# Patient Record
Sex: Male | Born: 1965
Health system: Southern US, Community
[De-identification: ages and names within clinical notes are randomized; demographics above are authoritative.]

## PROBLEM LIST (undated history)

## (undated) HISTORY — PX: ORIF DISTAL FEMUR FRACTURE: SUR926

## (undated) HISTORY — PX: OTHER SURGICAL HISTORY: SHX169

---

## 2016-03-23 ENCOUNTER — Inpatient Hospital Stay (HOSPITAL_COMMUNITY)
Admission: EM | Admit: 2016-03-23 | Discharge: 2016-03-27 | DRG: 607 | Disposition: A | Discharge status: Home / Self Care | Payer: Medicaid Other | Attending: Internal Medicine | Admitting: Internal Medicine

## 2016-03-23 ENCOUNTER — Encounter (HOSPITAL_COMMUNITY): Payer: Self-pay | Admitting: Emergency Medicine

## 2016-03-23 ENCOUNTER — Observation Stay (HOSPITAL_COMMUNITY): Payer: Medicaid Other

## 2016-03-23 ENCOUNTER — Emergency Department (HOSPITAL_COMMUNITY): Payer: Medicaid Other

## 2016-03-23 DIAGNOSIS — T50905A Adverse effect of unspecified drugs, medicaments and biological substances, initial encounter: Secondary | ICD-10-CM

## 2016-03-23 DIAGNOSIS — L0231 Cutaneous abscess of buttock: Secondary | ICD-10-CM | POA: Diagnosis present

## 2016-03-23 DIAGNOSIS — L270 Generalized skin eruption due to drugs and medicaments taken internally: Principal | ICD-10-CM | POA: Diagnosis present

## 2016-03-23 DIAGNOSIS — L02419 Cutaneous abscess of limb, unspecified: Secondary | ICD-10-CM

## 2016-03-23 DIAGNOSIS — R5381 Other malaise: Secondary | ICD-10-CM | POA: Diagnosis present

## 2016-03-23 DIAGNOSIS — M2550 Pain in unspecified joint: Secondary | ICD-10-CM

## 2016-03-23 DIAGNOSIS — Z720 Tobacco use: Secondary | ICD-10-CM

## 2016-03-23 DIAGNOSIS — Z882 Allergy status to sulfonamides status: Secondary | ICD-10-CM

## 2016-03-23 DIAGNOSIS — L02416 Cutaneous abscess of left lower limb: Secondary | ICD-10-CM | POA: Diagnosis present

## 2016-03-23 DIAGNOSIS — L0291 Cutaneous abscess, unspecified: Secondary | ICD-10-CM

## 2016-03-23 DIAGNOSIS — T7840XA Allergy, unspecified, initial encounter: Secondary | ICD-10-CM

## 2016-03-23 DIAGNOSIS — F17210 Nicotine dependence, cigarettes, uncomplicated: Secondary | ICD-10-CM | POA: Diagnosis present

## 2016-03-23 DIAGNOSIS — D72829 Elevated white blood cell count, unspecified: Secondary | ICD-10-CM

## 2016-03-23 DIAGNOSIS — K529 Noninfective gastroenteritis and colitis, unspecified: Secondary | ICD-10-CM | POA: Diagnosis present

## 2016-03-23 DIAGNOSIS — T370X5A Adverse effect of sulfonamides, initial encounter: Secondary | ICD-10-CM | POA: Diagnosis present

## 2016-03-23 DIAGNOSIS — E860 Dehydration: Secondary | ICD-10-CM | POA: Diagnosis present

## 2016-03-23 LAB — URINALYSIS, ROUTINE W REFLEX MICROSCOPIC
Glucose, UA: NEGATIVE mg/dL
KETONES UR: NEGATIVE mg/dL
Leukocytes, UA: NEGATIVE
Nitrite: NEGATIVE
PROTEIN: NEGATIVE mg/dL
Specific Gravity, Urine: 1.036 — ABNORMAL HIGH (ref 1.005–1.030)
pH: 5.5 (ref 5.0–8.0)

## 2016-03-23 LAB — CBC WITH DIFFERENTIAL/PLATELET
Basophils Absolute: 0 10*3/uL (ref 0.0–0.1)
Basophils Relative: 0 %
EOS PCT: 3 %
Eosinophils Absolute: 0.5 10*3/uL (ref 0.0–0.7)
HEMATOCRIT: 47.7 % (ref 39.0–52.0)
Hemoglobin: 17 g/dL (ref 13.0–17.0)
LYMPHS ABS: 4.8 10*3/uL — AB (ref 0.7–4.0)
Lymphocytes Relative: 31 %
MCH: 35.7 pg — ABNORMAL HIGH (ref 26.0–34.0)
MCHC: 35.6 g/dL (ref 30.0–36.0)
MCV: 100.2 fL — AB (ref 78.0–100.0)
MONOS PCT: 9 %
Monocytes Absolute: 1.4 10*3/uL — ABNORMAL HIGH (ref 0.1–1.0)
NEUTROS ABS: 8.7 10*3/uL — AB (ref 1.7–7.7)
NEUTROS PCT: 57 %
Platelets: 241 10*3/uL (ref 150–400)
RBC: 4.76 MIL/uL (ref 4.22–5.81)
RDW: 12.8 % (ref 11.5–15.5)
WBC: 15.4 10*3/uL — ABNORMAL HIGH (ref 4.0–10.5)

## 2016-03-23 LAB — URINE MICROSCOPIC-ADD ON: WBC, UA: NONE SEEN WBC/hpf (ref 0–5)

## 2016-03-23 LAB — HIV ANTIBODY (ROUTINE TESTING W REFLEX): HIV Screen 4th Generation wRfx: NONREACTIVE

## 2016-03-23 LAB — COMPREHENSIVE METABOLIC PANEL
ALK PHOS: 84 U/L (ref 38–126)
ALT: 30 U/L (ref 17–63)
AST: 32 U/L (ref 15–41)
Albumin: 4 g/dL (ref 3.5–5.0)
Anion gap: 8 (ref 5–15)
BILIRUBIN TOTAL: 0.9 mg/dL (ref 0.3–1.2)
BUN: 21 mg/dL — AB (ref 6–20)
CALCIUM: 9.6 mg/dL (ref 8.9–10.3)
CO2: 25 mmol/L (ref 22–32)
Chloride: 101 mmol/L (ref 101–111)
Creatinine, Ser: 0.94 mg/dL (ref 0.61–1.24)
GFR calc Af Amer: >60 mL/min (ref >60)
Glucose, Bld: 108 mg/dL — ABNORMAL HIGH (ref 65–99)
POTASSIUM: 4.1 mmol/L (ref 3.5–5.1)
Sodium: 134 mmol/L — ABNORMAL LOW (ref 135–145)
TOTAL PROTEIN: 8.8 g/dL — AB (ref 6.5–8.1)

## 2016-03-23 LAB — CK TOTAL AND CKMB (NOT AT ARMC)
CK TOTAL: 149 U/L (ref 49–397)
CK, MB: 3.4 ng/mL (ref 0.5–5.0)
RELATIVE INDEX: 2.3 (ref 0.0–2.5)

## 2016-03-23 LAB — SEDIMENTATION RATE: SED RATE: 14 mm/h (ref 0–16)

## 2016-03-23 LAB — LACTATE DEHYDROGENASE: LDH: 140 U/L (ref 98–192)

## 2016-03-23 LAB — C-REACTIVE PROTEIN: CRP: 3.3 mg/dL — AB (ref <1.0)

## 2016-03-23 MED ORDER — IOPAMIDOL (ISOVUE-300) INJECTION 61%
INTRAVENOUS | Status: AC
  Administered 2016-03-23: 100 mL
  Filled 2016-03-23: qty 100

## 2016-03-23 MED ORDER — SODIUM CHLORIDE 0.9 % IV BOLUS (SEPSIS)
1000.0000 mL | Freq: Once | INTRAVENOUS | Status: AC
  Administered 2016-03-23: 1000 mL via INTRAVENOUS

## 2016-03-23 MED ORDER — ONDANSETRON HCL 4 MG PO TABS: 4.0000 mg | ORAL_TABLET | Freq: Four times a day (QID) | ORAL | Status: DC | PRN

## 2016-03-23 MED ORDER — ACETAMINOPHEN 325 MG PO TABS: 650.0000 mg | ORAL_TABLET | Freq: Four times a day (QID) | ORAL | Status: DC | PRN

## 2016-03-23 MED ORDER — VANCOMYCIN HCL IN DEXTROSE 1-5 GM/200ML-% IV SOLN
1000.0000 mg | Freq: Two times a day (BID) | INTRAVENOUS | Status: DC
  Administered 2016-03-23 – 2016-03-26 (×6): 1000 mg via INTRAVENOUS
  Filled 2016-03-23 (×7): qty 200

## 2016-03-23 MED ORDER — KETOROLAC TROMETHAMINE 15 MG/ML IJ SOLN
15.0000 mg | Freq: Four times a day (QID) | INTRAMUSCULAR | Status: DC
  Administered 2016-03-23 – 2016-03-24 (×4): 15 mg via INTRAVENOUS
  Filled 2016-03-23 (×5): qty 1

## 2016-03-23 MED ORDER — PIPERACILLIN-TAZOBACTAM 3.375 G IVPB
3.3750 g | Freq: Three times a day (TID) | INTRAVENOUS | Status: DC
  Administered 2016-03-23 – 2016-03-24 (×4): 3.375 g via INTRAVENOUS
  Filled 2016-03-23 (×6): qty 50

## 2016-03-23 MED ORDER — ENOXAPARIN SODIUM 40 MG/0.4ML ~~LOC~~ SOLN
40.0000 mg | SUBCUTANEOUS | Status: DC
  Administered 2016-03-23: 40 mg via SUBCUTANEOUS

## 2016-03-23 MED ORDER — ACETAMINOPHEN 650 MG RE SUPP: 650.0000 mg | Freq: Four times a day (QID) | RECTAL | Status: DC | PRN

## 2016-03-23 MED ORDER — SODIUM CHLORIDE 0.9 % IV SOLN
INTRAVENOUS | Status: DC
  Administered 2016-03-23 – 2016-03-25 (×4): via INTRAVENOUS

## 2016-03-23 MED ORDER — ONDANSETRON HCL 4 MG/2ML IJ SOLN: 4.0000 mg | Freq: Four times a day (QID) | INTRAMUSCULAR | Status: DC | PRN

## 2016-03-23 MED ORDER — KETOROLAC TROMETHAMINE 30 MG/ML IJ SOLN
30.0000 mg | Freq: Once | INTRAMUSCULAR | Status: AC
  Administered 2016-03-23: 30 mg via INTRAVENOUS
  Filled 2016-03-23: qty 1

## 2016-03-23 MED ORDER — VANCOMYCIN HCL 10 G IV SOLR
1500.0000 mg | Freq: Once | INTRAVENOUS | Status: AC
  Administered 2016-03-23: 1500 mg via INTRAVENOUS
  Filled 2016-03-23: qty 1500

## 2016-03-23 MED ORDER — NICOTINE 21 MG/24HR TD PT24
21.0000 mg | MEDICATED_PATCH | Freq: Every day | TRANSDERMAL | Status: DC
  Filled 2016-03-23 (×4): qty 1

## 2016-03-23 MED ORDER — DIAZEPAM 5 MG/ML IJ SOLN
1.0000 mg | Freq: Once | INTRAMUSCULAR | Status: AC
  Administered 2016-03-23: 1 mg via INTRAVENOUS
  Filled 2016-03-23: qty 2

## 2016-03-23 NOTE — H&P (Signed)
History and Physical    Richard Krueger MOQ:[id number] DOB: 26-May-1966 DOA: 03/23/2016   PCP: No PCP Per Patient   Patient coming from/Resides with: Private residence/lives with juvenile son  Chief Complaint: Diffuse myalgias and rash  HPI: Richard Krueger is a 50 y.o. male with medical history significant for ongoing tobacco abuse, remote orthopedic surgery with postoperative staph infection (patient thinks was MRSA) who was recently treated for an uncomplicated superficial abscess on the right medial thigh while working in [place] this past week. Patient noted that the abscess initially began as a tiny maculopapular area and increased in size this was associated with low-grade fevers. He went to the ER over the weekend where an local I&D was performed and cultures were obtained. Patient was empirically started on Bactrim. By Monday evening into Tuesday morning he had noticed a diffuse maculopapular rash without blisters primarily in the legs but someone in the upper extremities. There was no pruritis or urticaria/"hives". Patient stopped the Bactrim since he presumed this was the cause. He took a total of 9 pills over 4 days as instructed. He is not taking any other medications. He has not started any new medications, skin lotions, no changes in soap or washing powder. He has persisted with a low-grade fever. Since that time he has also had some generalized malaise with upset stomach without nausea vomiting or diarrhea, anorexia and poor oral intake. Has 24 hours of malaise has progressed to significant fatigue and reports of muscle tightness. No involvement of the jaw. Patient denies knowledge of tick bite.  ED Course:  Vital signs: 97.9-141/99-92-20 AAS: Chest x-ray unremarkable, nondilated gas-filled small bowel with focal thickening that may indicate enteritis or ileus no findings to suggest obstruction Lab data: Sodium 134, potassium 4.1, CO2 25, BUN 21, creatinine 0.94, glucose  108, LFTs are normal, total protein slightly elevated at 8.8, calcium 9.6, CK 149, WBC 15,400 with normal differential except for mildly elevated neutrophils 8.7% and mildly elevated lymphocytes of 4.8% and mildly elevated monocytes of 1.4%, eosinophils are normal in pathologist describes toxic granulation of WBCs; urinalysis abnormal with cloudy appearance, rare bacteria, small bilirubin, hyaline casts, amber color, small hemoglobin, no WBCs, no leukocytes, no nitrite, specific gravity elevated at 1.036 Medications and treatments: Normal saline bolus 1 L 2, Toradol 30 mg IV 1, Valium 1 mg IV 1  Review of Systems:  In addition to the HPI above,  No Headache, changes with Vision or hearing, new weakness, tingling, numbness in any extremity, No problems swallowing food or Liquids No Chest pain, Cough or Shortness of Breath, palpitations, orthopnea or DOE No Abdominal pain, emesis; no melena or hematochezia, no dark tarry stools No dysuria, hematuria or flank pain No new skin rashes, lesions, masses or bruises,s No recent weight gain or loss No polyuria, polydypsia or polyphagia,   History reviewed. No pertinent past medical history.  Past Surgical History:  Procedure Laterality Date  . ankle repair    . ORIF DISTAL FEMUR FRACTURE      Social History   Social History  . Marital status: Divorced    Spouse name: N/A  . Number of children: N/A  . Years of education: N/A   Occupational History  . Not on file.   Social History Main Topics  . Smoking status: Current Every Day Smoker  . Smokeless tobacco: Never Used  . Alcohol use Yes  . Drug use: No  . Sexual activity: Not on file   Other Topics Concern  .  Not on file   Social History Narrative  . No narrative on file    Mobility: Ambulates without assistive devices Work history: Currently employed   Allergies  Allergen Reactions  . Sulfa Antibiotics    Family history reviewed and no known history of inflammatory  bowel disease or rheumatological disorders  Prior to Admission medications   Not on File    Physical Exam: Vitals:   03/23/16 0700 03/23/16 0715 03/23/16 0745 03/23/16 0815  BP: 128/71 121/72 121/83 127/75  Pulse: 73 67 80 70  Resp:  16 16 16   Temp:      TempSrc:      SpO2: 93% 97% 99% 98%  Weight:      Height:          Constitutional: NAD, calm, comfortable Eyes: PERRL, lids and conjunctivae normal ENMT: Mucous membranes are moist. Posterior pharynx clear of any exudate or lesions.Normal dentition.  Neck: normal, supple, no masses, no thyromegaly Respiratory: clear to auscultation bilaterally, no wheezing, no crackles. Normal respiratory effort. No accessory muscle use.  Cardiovascular: Regular rate and rhythm, no murmurs / rubs / gallops. No extremity edema. 2+ pedal pulses. No carotid bruits.  Abdomen: no tenderness, no masses palpated. No hepatosplenomegaly. Bowel sounds positive.  Musculoskeletal: no clubbing / cyanosis. No joint deformity upper and lower extremities. Good ROM, no contractures. Normal muscle tone.  Skin: Patient has small quarter-sized lesion with appearance of abscess and right medial thigh-small ulcerated center with purulent drainage expressed and areas quite tender to touch; patient also has maculopapular rash diffusely over the lower extremities-these lesions are very tiny about 2-3 mm maximum size and are subtly raised and blanchable-there are areas on the upper extremities that appear to be similar but fading and the parents and likely resolving Neurologic: CN 2-12 grossly intact. Sensation intact, DTR normal. Strength 5/5 x all 4 extremities.  Psychiatric: Normal judgment and insight. Alert and oriented x 3. Normal mood.   Rash:   Right thigh abscess:   Labs on Admission: I have personally reviewed following labs and imaging studies  CBC:  Recent Labs Lab 03/23/16 0159  WBC 15.4*  NEUTROABS 8.7*  HGB 17.0  HCT 47.7  MCV 100.2*  PLT 241     Basic Metabolic Panel:  Recent Labs Lab 03/23/16 0159  NA 134*  K 4.1  CL 101  CO2 25  GLUCOSE 108*  BUN 21*  CREATININE 0.94  CALCIUM 9.6   GFR: Estimated Creatinine Clearance: 103.4 mL/min (by C-G formula based on SCr of 0.94 mg/dL). Liver Function Tests:  Recent Labs Lab 03/23/16 0159  AST 32  ALT 30  ALKPHOS 84  BILITOT 0.9  PROT 8.8*  ALBUMIN 4.0   No results for input(s): LIPASE, AMYLASE in the last 168 hours. No results for input(s): AMMONIA in the last 168 hours. Coagulation Profile: No results for input(s): INR, PROTIME in the last 168 hours. Cardiac Enzymes:  Recent Labs Lab 03/23/16 0159  CKTOTAL 149  CKMB 3.4   BNP (last 3 results) No results for input(s): PROBNP in the last 8760 hours. HbA1C: No results for input(s): HGBA1C in the last 72 hours. CBG: No results for input(s): GLUCAP in the last 168 hours. Lipid Profile: No results for input(s): CHOL, HDL, LDLCALC, TRIG, CHOLHDL, LDLDIRECT in the last 72 hours. Thyroid Function Tests: No results for input(s): TSH, T4TOTAL, FREET4, T3FREE, THYROIDAB in the last 72 hours. Anemia Panel: No results for input(s): VITAMINB12, FOLATE, FERRITIN, TIBC, IRON, RETICCTPCT in the last 72  hours. Urine analysis:    Component Value Date/Time   COLORURINE AMBER (A) 03/23/2016 0200   APPEARANCEUR CLOUDY (A) 03/23/2016 0200   LABSPEC 1.036 (H) 03/23/2016 0200   PHURINE 5.5 03/23/2016 0200   GLUCOSEU NEGATIVE 03/23/2016 0200   HGBUR SMALL (A) 03/23/2016 0200   BILIRUBINUR SMALL (A) 03/23/2016 0200   KETONESUR NEGATIVE 03/23/2016 0200   PROTEINUR NEGATIVE 03/23/2016 0200   NITRITE NEGATIVE 03/23/2016 0200   LEUKOCYTESUR NEGATIVE 03/23/2016 0200   Sepsis Labs: @LABRCNTIP (procalcitonin:4,lacticidven:4) )No results found for this or any previous visit (from the past 240 hour(s)).   Radiological Exams on Admission: Dg Abdomen Acute W/chest  Result Date: 03/23/2016 CLINICAL DATA:  Allergic reaction to  the antibiotic. Patient has lost muscle control of legs. Fever. EXAM: DG ABDOMEN ACUTE W/ 1V CHEST COMPARISON:  None. FINDINGS: Normal heart size and pulmonary vascularity. No focal airspace disease or consolidation in the lungs. No blunting of costophrenic angles. No pneumothorax. Mediastinal contours appear intact. Scattered gas and stool throughout the colon. Gas-filled nondilated left upper quadrant small bowel with apparent fold thickening. This may be due to ileus or enteritis. No free intra-abdominal air. No abnormal air-fluid levels. No radiopaque stones. Visualized bones appear intact. IMPRESSION: No evidence of active pulmonary disease. Nondilated gas-filled small bowel with fold thickening may indicate enteritis or ileus. No findings to suggest obstruction. Electronically Signed   By: Lucienne Capers M.D.   On: 03/23/2016 06:33     Assessment/Plan Principal Problem:   Allergic reaction caused by a drug -Patient presents with partially resolving rash (after cessation of medication) presumably related to Bactrim; initial and current rash not urticarial and appears similar to a petechial rash but platelets are normal -Patient reports related malaise, fatigue and myalgias with normal CK i.e. no evidence of acute myositis -Check CRP and ESR -Current eosinophils normal -Since not urticarial no indication for steroids and rash should be self-limiting -Scheduled IV Toradol for myalgias -LFTs are normal  Active Problems:   Leukocytosis/Thigh abscess -Patient presents with incompletely treated thigh abscess which is currently still expressing purulent drainage and is tender and somewhat erythematous -he also has persistent associated leukocytosis -Obtain blood cultures -I have requested records from the [facility] in [place] (primarily wound cultures obtained in the ER) -Patient reports history of prior significant staph infection that he was told was "a super staph" so  potentially possible history of MRSA so have asked pharmacy to dose vancomycin -ESR and CRP as above; also check HIV -WOC RN eval for recommendations but likely once to twice daily Neosporin dressings would be adequate    ?? Enteritis -Patient endorses vague GI symptoms without emesis or diarrhea but does describe nausea anorexia and poor oral intake -Plain x-ray suggestive of a possible enteritis versus ileus pattern that could either be related to acute allergic reaction as documented above were potentially related to GI effects from antibiotics -Monitor for onset of diarrhea -Allow regular diet for now -No known history of inflammatory bowel disease or celiac disease and has noted no prior recent history of diarrhea; abdominal exam benign    Dehydration, moderate -Patient endorsed poor oral intake for several days and he appears clinically dehydrated based on elevated urine specific gravity -Has received 2 L of fluid in the ER and will continue IV fluids at 75 an hour for now -Labs in a.m.    Tobacco abuse -Nicotine patch      DVT prophylaxis: Lovenox Code Status: Full Family Communication: The only family at the  patient's bedside was his juvenile son Disposition Plan: Anticipate discharge back to preadmission home environment once medically stable Consults called: None  Admission status: Observation/medical floor    Samie Barclift L. ANP-BC [facility] Pager [phone]   If 7PM-7AM, please contact night-coverage [email or web address] Password [facility]  03/23/2016, 8:17 AM

## 2016-03-23 NOTE — ED Triage Notes (Signed)
Patient states that he was seen on Saturday and had a boil lanced in [place] in [place].  Patient now having right arm swelling into hand, red spots coming up on inner thighs bilaterally.  Patient having muscle and joint pain and pain when walking.  Patient states that it is more on the left than right.

## 2016-03-23 NOTE — ED Notes (Signed)
Report attempted x 1

## 2016-03-23 NOTE — Progress Notes (Signed)
Pharmacy Antibiotic Note  Richard Krueger is a 50 y.o. male admitted on 03/23/2016 with cellulitis.  Pharmacy has been consulted for vancomycin dosing.  Pt had [facility] (left upper leg at the crease of buttock).  Now has rash on legs and arms and L leg swollen. He has been on bactrim since 8/12 ED visit.  Wt 86.2 kg, WBC 15.4, ANC 8.7, creat 0.94, AF.   Plan: vancomycin 1500 mg loading dose in the ED followed by Vancomycin 1000 IV every 12 hours.  Goal trough 10-15 mcg/mL.  F/u renal fxn, wbc, temp, cultures VT as needed  Height: 5\' 9"  (175.3 cm) Weight: 190 lb (86.2 kg) IBW/kg (Calculated) : 70.7  Temp (24hrs), Avg:97.9 F (36.6 C), Min:97.9 F (36.6 C), Max:97.9 F (36.6 C)   Recent Labs Lab 03/23/16 0159  WBC 15.4*  CREATININE 0.94    Estimated Creatinine Clearance: 103.4 mL/min (by C-G formula based on SCr of 0.94 mg/dL).    Allergies  Allergen Reactions  . Sulfa Antibiotics     Antimicrobials this admission: vanc 8/18>>  Dose adjustments this admission:  Microbiology results: 8/18 BCx2>> HIV>>  Thank you for allowing pharmacy to be a part of this patient's care.   Herby AbrahamMichelle T. Bertie Simien, Pharm.D. [phone] 03/23/2016 7:55 AM

## 2016-03-23 NOTE — ED Notes (Signed)
Admitting at bedside 

## 2016-03-23 NOTE — ED Notes (Signed)
Patient states on Aug 12 he was seen at a ED in [place] and had an abscess drained (left upper leg at the crease of the buttock)  Stated he started feeling better for a couple of days and then started with a rash (red areas on legs and arms) right hand swollen and left leg swollen.Patient ambulates but moves very slowly

## 2016-03-23 NOTE — ED Notes (Signed)
[facility] Lab tech at bedside drawing second set of blood cultures.

## 2016-03-23 NOTE — Progress Notes (Addendum)
ADDENDUM: Patient also has another nodular abscess on the left gluteal region-I neglected to add this to the physical exam or problem list at time of admission. Highly appreciate general surgery evaluation  1816:  I have received the wound culture report from the hospital in [place] (in hard copy chart at nurses station): MRSA sensitive to Tygacil, Cipro, gentamicin, levaquin, ofloxacin, rifampin, vancomycin. We'll continue IV vancomycin for now. Follow-up on blood cultures and if these are positive patient will need to undergo echocardiogram to ensure no endocarditis.  Junious SilkAllison Kerrington Greenhalgh, ANP

## 2016-03-23 NOTE — ED Provider Notes (Signed)
[facility]-EMERGENCY DEPT Provider Note   CSN: [id number] Arrival date & time: 03/23/16  0141     History   Chief Complaint Chief Complaint  Patient presents with  . Arm Swelling    HPI Richard Krueger is a 50 y.o. male.  The history is provided by the patient.  Rash   This is a new problem. The current episode started more than 2 days ago. The problem has been gradually improving. Associated with: lanced abscesses in [place] started on Bactrim DS. There has been no fever. The rash is present on the left upper leg, left lower leg, right arm, right lower leg, right upper leg and left arm. The pain is moderate. The pain has been constant since onset. Associated symptoms include pain. Pertinent negatives include no blisters. The treatment provided no relief. Risk factors include new medications.  Seen in [place] for 2 abscesses one where L posterior upper leg meets buttocks one on right hip area.  Started on bactrim and diffuse purple rashe developed.  No has forearm edema and joint pain and stiffness all over despite stopping meds on Tuesday  History reviewed. No pertinent past medical history.  There are no active problems to display for this patient.   Past Surgical History:  Procedure Laterality Date  . ankle repair    . ORIF DISTAL FEMUR FRACTURE         Home Medications    Prior to Admission medications   Not on File    Family History History reviewed. No pertinent family history.  Social History Social History  Substance Use Topics  . Smoking status: Current Every Day Smoker  . Smokeless tobacco: Never Used  . Alcohol use Yes     Allergies   Sulfa antibiotics   Review of Systems Review of Systems  Constitutional: Negative for fever.  HENT: Negative for congestion, drooling and trouble swallowing.   Respiratory: Negative for shortness of breath.   Cardiovascular: Negative for chest pain.  Gastrointestinal: Positive for abdominal distention. Negative for  constipation.  Genitourinary: Negative for dysuria.  Musculoskeletal: Positive for arthralgias and joint swelling. Negative for neck pain and neck stiffness.  Skin: Positive for rash.  Neurological: Negative for syncope.  Hematological: Negative for adenopathy.  All other systems reviewed and are negative.    Physical Exam Updated Vital Signs BP 141/99   Pulse 92   Temp 97.9 F (36.6 C) (Oral)   Resp 20   Ht 5\' 9"  (1.753 m)   Wt 190 lb (86.2 kg)   SpO2 95%   BMI 28.06 kg/m   Physical Exam  Constitutional: He is oriented to person, place, and time. He appears well-developed and well-nourished. No distress.  HENT:  Head: Normocephalic and atraumatic.  Nose: Nose normal.  Mouth/Throat: No oropharyngeal exudate.  Eyes: Conjunctivae and EOM are normal. Pupils are equal, round, and reactive to light.  Neck: Normal range of motion. Neck supple.  Cardiovascular: Normal rate and regular rhythm.   Pulmonary/Chest: Effort normal and breath sounds normal. No stridor. He has no wheezes. He has no rales.  Abdominal: Soft. Bowel sounds are normal. He exhibits no mass. There is no tenderness. There is no rebound and no guarding.  Musculoskeletal: He exhibits edema.  Edema of B dorsal forearmd  Neurological: He is alert and oriented to person, place, and time. He has normal reflexes.  Skin: Skin is warm and dry. Capillary refill takes less than 2 seconds. Rash noted.     ED Treatments / Results  Labs (all labs ordered are listed, but only abnormal results are displayed) Labs Reviewed  CBC WITH DIFFERENTIAL/PLATELET - Abnormal; Notable for the following:       Result Value   WBC 15.4 (*)    MCV 100.2 (*)    MCH 35.7 (*)    Neutro Abs 8.7 (*)    Lymphs Abs 4.8 (*)    Monocytes Absolute 1.4 (*)    All other components within normal limits  COMPREHENSIVE METABOLIC PANEL - Abnormal; Notable for the following:    Sodium 134 (*)    Glucose, Bld 108 (*)    BUN 21 (*)    Total Protein  8.8 (*)    All other components within normal limits  URINALYSIS, ROUTINE W REFLEX MICROSCOPIC (NOT AT [facility]) - Abnormal; Notable for the following:    Color, Urine AMBER (*)    APPearance CLOUDY (*)    Specific Gravity, Urine 1.036 (*)    Hgb urine dipstick SMALL (*)    Bilirubin Urine SMALL (*)    All other components within normal limits  URINE MICROSCOPIC-ADD ON - Abnormal; Notable for the following:    Squamous Epithelial / LPF 0-5 (*)    Bacteria, UA RARE (*)    Casts HYALINE CASTS (*)    All other components within normal limits    EKG  EKG Interpretation None       Radiology No results found.  Procedures Procedures (including critical care time)  Medications Ordered in ED Medications  sodium chloride 0.9 % bolus 1,000 mL (not administered)  ketorolac (TORADOL) 30 MG/ML injection 30 mg (not administered)     Initial Impression / Assessment and Plan / ED Course  I have reviewed the triage vital signs and the nursing notes.  Pertinent labs & imaging results that were available during my care of the patient were reviewed by me and considered in my medical decision making (see chart for details).  Clinical Course    Vitals:   03/23/16 0148 03/23/16 0445  BP: 141/99 138/99  Pulse: 92 80  Resp: 20   Temp: 97.9 F (36.6 C)    Results for orders placed or performed during the hospital encounter of 03/23/16  CBC with Differential  Result Value Ref Range   WBC 15.4 (H) 4.0 - 10.5 K/uL   RBC 4.76 4.22 - 5.81 MIL/uL   Hemoglobin 17.0 13.0 - 17.0 g/dL   HCT 16.147.7 09.639.0 - 04.552.0 %   MCV 100.2 (H) 78.0 - 100.0 fL   MCH 35.7 (H) 26.0 - 34.0 pg   MCHC 35.6 30.0 - 36.0 g/dL   RDW 40.912.8 81.111.5 - 91.415.5 %   Platelets 241 150 - 400 K/uL   Neutrophils Relative % 57 %   Lymphocytes Relative 31 %   Monocytes Relative 9 %   Eosinophils Relative 3 %   Basophils Relative 0 %   Neutro Abs 8.7 (H) 1.7 - 7.7 K/uL   Lymphs Abs 4.8 (H) 0.7 - 4.0 K/uL   Monocytes Absolute 1.4 (H)  0.1 - 1.0 K/uL   Eosinophils Absolute 0.5 0.0 - 0.7 K/uL   Basophils Absolute 0.0 0.0 - 0.1 K/uL   WBC Morphology TOXIC GRANULATION   Comprehensive metabolic panel  Result Value Ref Range   Sodium 134 (L) 135 - 145 mmol/L   Potassium 4.1 3.5 - 5.1 mmol/L   Chloride 101 101 - 111 mmol/L   CO2 25 22 - 32 mmol/L   Glucose, Bld 108 (H) 65 -  99 mg/dL   BUN 21 (H) 6 - 20 mg/dL   Creatinine, Ser 1.61 0.61 - 1.24 mg/dL   Calcium 9.6 8.9 - 09.6 mg/dL   Total Protein 8.8 (H) 6.5 - 8.1 g/dL   Albumin 4.0 3.5 - 5.0 g/dL   AST 32 15 - 41 U/L   ALT 30 17 - 63 U/L   Alkaline Phosphatase 84 38 - 126 U/L   Total Bilirubin 0.9 0.3 - 1.2 mg/dL   GFR calc non Af Amer >60 >60 mL/min   GFR calc Af Amer >60 >60 mL/min   Anion gap 8 5 - 15  Urinalysis, Routine w reflex microscopic (not at [facility])  Result Value Ref Range   Color, Urine AMBER (A) YELLOW   APPearance CLOUDY (A) CLEAR   Specific Gravity, Urine 1.036 (H) 1.005 - 1.030   pH 5.5 5.0 - 8.0   Glucose, UA NEGATIVE NEGATIVE mg/dL   Hgb urine dipstick SMALL (A) NEGATIVE   Bilirubin Urine SMALL (A) NEGATIVE   Ketones, ur NEGATIVE NEGATIVE mg/dL   Protein, ur NEGATIVE NEGATIVE mg/dL   Nitrite NEGATIVE NEGATIVE   Leukocytes, UA NEGATIVE NEGATIVE  Urine microscopic-add on  Result Value Ref Range   Squamous Epithelial / LPF 0-5 (A) NONE SEEN   WBC, UA NONE SEEN 0 - 5 WBC/hpf   RBC / HPF 6-30 0 - 5 RBC/hpf   Bacteria, UA RARE (A) NONE SEEN   Casts HYALINE CASTS (A) NEGATIVE   Urine-Other MUCOUS PRESENT   CK total and CKMB (cardiac)not at [facility]  Result Value Ref Range   Total CK 149 49 - 397 U/L   CK, MB 3.4 0.5 - 5.0 ng/mL   Relative Index 2.3 0.0 - 2.5   No results found. Medications  sodium chloride 0.9 % bolus 1,000 mL (not administered)  sodium chloride 0.9 % bolus 1,000 mL (1,000 mLs Intravenous New Bag/Given 03/23/16 0525)  ketorolac (TORADOL) 30 MG/ML injection 30 mg (30 mg Intravenous Given 03/23/16 0532)     Final Clinical  Impressions(s) / ED Diagnoses   Final diagnoses:  None    New Prescriptions New Prescriptions   No medications on file  I suspect there is a secondary issue as bactrim alone does not cause stiffness.    Will admit to [facility]   Korinne Greenstein, MD 03/23/16 [phone]

## 2016-03-23 NOTE — Consult Note (Signed)
[facility] Surgery Consult Note  Richard Krueger November 28, 1965  [id number].    Requesting MD: Dr. Marthenia Rolling, internal medicine Chief Complaint/Reason for Consult: Left thigh abscess  HPI:  Richard Krueger is a 50yo male who presented to [facility] early this morning with complaint of diffuse myalgias and rash. Patient states that about 2 weeks ago he noticed a small pimple on his posterior/proximal left thigh. This gradually got worse and turned into an abscess. Went to the ED in [place] 03/17/16 where the abscess was I&D'd and cultures obtained (requested results to be faxed here but have not yet received them). He also has a smaller abscess on the proximal/medial right thigh. He was started on Bactrim and after a few days of taking this medication he had a gradually worsening maculopapular rash, myalgias, and low grade fevers.   No known significant PMH Smokes 1 PPD   ROS: All systems reviewed and otherwise negative except for as above  History reviewed. No pertinent family history.  History reviewed. No pertinent past medical history.  Past Surgical History:  Procedure Laterality Date  . ankle repair    . ORIF DISTAL FEMUR FRACTURE      Social History:  reports that he has been smoking.  He has a 38.00 pack-year smoking history. He has never used smokeless tobacco. He reports that he drinks alcohol. He reports that he does not use drugs.  Allergies:  Allergies  Allergen Reactions  . Sulfa Antibiotics Other (See Comments)    Swollen muscles, stiff joints and break out    Medications Prior to Admission  Medication Sig Dispense Refill  . Benzocaine (BOIL-EASE EX) Apply 1 application topically as needed (for boil).      Blood pressure (!) 126/51, pulse 68, temperature 97.3 F (36.3 C), temperature source Oral, resp. rate 20, height 5' 9"  (1.753 m), weight 190 lb (86.2 kg), SpO2 98 %. Physical Exam: General: WD/WN male who is laying in bed in NAD HEENT: head is  normocephalic, atraumatic.   Heart: RRR.  Palpable pedal pulses bilaterally Lungs: CTAB Abd: soft, NT/ND, +BS Skin: 3x4cm abscess posterior/proxiaml left thigh that is ulcerated and currently with purulent drainage and surrounding erythema (seen below); small quarter-sized abscess and right proximal/medial thigh-small ulcerated center and tender; patient also has a diffuse maculopapular rash over the lower extremities Psych: A&Ox3 with an appropriate affect.      Results for orders placed or performed during the hospital encounter of 03/23/16 (from the past 48 hour(s))  CBC with Differential     Status: Abnormal   Collection Time: 03/23/16  1:59 AM  Result Value Ref Range   WBC 15.4 (H) 4.0 - 10.5 K/uL   RBC 4.76 4.22 - 5.81 MIL/uL   Hemoglobin 17.0 13.0 - 17.0 g/dL   HCT 47.7 39.0 - 52.0 %   MCV 100.2 (H) 78.0 - 100.0 fL   MCH 35.7 (H) 26.0 - 34.0 pg   MCHC 35.6 30.0 - 36.0 g/dL   RDW 12.8 11.5 - 15.5 %   Platelets 241 150 - 400 K/uL   Neutrophils Relative % 57 %   Lymphocytes Relative 31 %   Monocytes Relative 9 %   Eosinophils Relative 3 %   Basophils Relative 0 %   Neutro Abs 8.7 (H) 1.7 - 7.7 K/uL   Lymphs Abs 4.8 (H) 0.7 - 4.0 K/uL   Monocytes Absolute 1.4 (H) 0.1 - 1.0 K/uL   Eosinophils Absolute 0.5 0.0 - 0.7 K/uL   Basophils Absolute 0.0 0.0 -  0.1 K/uL   WBC Morphology TOXIC GRANULATION   Comprehensive metabolic panel     Status: Abnormal   Collection Time: 03/23/16  1:59 AM  Result Value Ref Range   Sodium 134 (L) 135 - 145 mmol/L   Potassium 4.1 3.5 - 5.1 mmol/L   Chloride 101 101 - 111 mmol/L   CO2 25 22 - 32 mmol/L   Glucose, Bld 108 (H) 65 - 99 mg/dL   BUN 21 (H) 6 - 20 mg/dL   Creatinine, Ser 0.94 0.61 - 1.24 mg/dL   Calcium 9.6 8.9 - 10.3 mg/dL   Total Protein 8.8 (H) 6.5 - 8.1 g/dL   Albumin 4.0 3.5 - 5.0 g/dL   AST 32 15 - 41 U/L   ALT 30 17 - 63 U/L   Alkaline Phosphatase 84 38 - 126 U/L   Total Bilirubin 0.9 0.3 - 1.2 mg/dL   GFR calc non Af  Amer >60 >60 mL/min   GFR calc Af Amer >60 >60 mL/min    Comment: (NOTE) The eGFR has been calculated using the CKD EPI equation. This calculation has not been validated in all clinical situations. eGFR's persistently <60 mL/min signify possible Chronic Kidney Disease.    Anion gap 8 5 - 15  CK total and CKMB (cardiac)not at [facility]     Status: None   Collection Time: 03/23/16  1:59 AM  Result Value Ref Range   Total CK 149 49 - 397 U/L   CK, MB 3.4 0.5 - 5.0 ng/mL   Relative Index 2.3 0.0 - 2.5  Urinalysis, Routine w reflex microscopic (not at [facility])     Status: Abnormal   Collection Time: 03/23/16  2:00 AM  Result Value Ref Range   Color, Urine AMBER (A) YELLOW    Comment: BIOCHEMICALS MAY BE AFFECTED BY COLOR   APPearance CLOUDY (A) CLEAR   Specific Gravity, Urine 1.036 (H) 1.005 - 1.030   pH 5.5 5.0 - 8.0   Glucose, UA NEGATIVE NEGATIVE mg/dL   Hgb urine dipstick SMALL (A) NEGATIVE   Bilirubin Urine SMALL (A) NEGATIVE   Ketones, ur NEGATIVE NEGATIVE mg/dL   Protein, ur NEGATIVE NEGATIVE mg/dL   Nitrite NEGATIVE NEGATIVE   Leukocytes, UA NEGATIVE NEGATIVE  Urine microscopic-add on     Status: Abnormal   Collection Time: 03/23/16  2:00 AM  Result Value Ref Range   Squamous Epithelial / LPF 0-5 (A) NONE SEEN   WBC, UA NONE SEEN 0 - 5 WBC/hpf   RBC / HPF 6-30 0 - 5 RBC/hpf   Bacteria, UA RARE (A) NONE SEEN   Casts HYALINE CASTS (A) NEGATIVE   Urine-Other MUCOUS PRESENT   Sedimentation rate     Status: None   Collection Time: 03/23/16  8:00 AM  Result Value Ref Range   Sed Rate 14 0 - 16 mm/hr  C-reactive protein     Status: Abnormal   Collection Time: 03/23/16  8:00 AM  Result Value Ref Range   CRP 3.3 (H) <1.0 mg/dL  Lactate dehydrogenase     Status: None   Collection Time: 03/23/16  8:00 AM  Result Value Ref Range   LDH 140 98 - 192 U/L   Dg Abdomen Acute W/chest  Result Date: 03/23/2016 CLINICAL DATA:  Allergic reaction to the antibiotic. Patient has lost muscle  control of legs. Fever. EXAM: DG ABDOMEN ACUTE W/ 1V CHEST COMPARISON:  None. FINDINGS: Normal heart size and pulmonary vascularity. No focal airspace disease or consolidation in the  lungs. No blunting of costophrenic angles. No pneumothorax. Mediastinal contours appear intact. Scattered gas and stool throughout the colon. Gas-filled nondilated left upper quadrant small bowel with apparent fold thickening. This may be due to ileus or enteritis. No free intra-abdominal air. No abnormal air-fluid levels. No radiopaque stones. Visualized bones appear intact. IMPRESSION: No evidence of active pulmonary disease. Nondilated gas-filled small bowel with fold thickening may indicate enteritis or ileus. No findings to suggest obstruction. Electronically Signed   By: Lucienne Capers M.D.   On: 03/23/2016 06:33      Assessment/Plan 1.  I saw and evaluated this patient with Dr. Dalbert Batman. 2.  Recommend CT with contrast of pelvis to better evaluate left proximal/posterior abscess and rule out undrained abscess. Patient is very tender on exam and will likely not tolerate bedside I&D. May need OR I&D following CT scan. 3.  NPO after midnight 4.  Continue vanc and start zosyn 5. Continue daily dry dressing changes for now.  Jerrye Beavers, [facility] Surgery 03/23/2016, 2:39 PM Pager: [phone] Consults: [phone] Mon-Fri 7:00 am-4:30 pm Sat-Sun 7:00 am-11:30 am

## 2016-03-24 DIAGNOSIS — M2550 Pain in unspecified joint: Secondary | ICD-10-CM | POA: Diagnosis not present

## 2016-03-24 DIAGNOSIS — F17210 Nicotine dependence, cigarettes, uncomplicated: Secondary | ICD-10-CM | POA: Diagnosis present

## 2016-03-24 DIAGNOSIS — L270 Generalized skin eruption due to drugs and medicaments taken internally: Secondary | ICD-10-CM | POA: Diagnosis not present

## 2016-03-24 DIAGNOSIS — K529 Noninfective gastroenteritis and colitis, unspecified: Secondary | ICD-10-CM | POA: Diagnosis present

## 2016-03-24 DIAGNOSIS — E860 Dehydration: Secondary | ICD-10-CM | POA: Diagnosis present

## 2016-03-24 DIAGNOSIS — R5381 Other malaise: Secondary | ICD-10-CM | POA: Diagnosis present

## 2016-03-24 DIAGNOSIS — Z882 Allergy status to sulfonamides status: Secondary | ICD-10-CM | POA: Diagnosis not present

## 2016-03-24 DIAGNOSIS — L0231 Cutaneous abscess of buttock: Secondary | ICD-10-CM

## 2016-03-24 DIAGNOSIS — L02416 Cutaneous abscess of left lower limb: Secondary | ICD-10-CM | POA: Diagnosis present

## 2016-03-24 DIAGNOSIS — T370X5A Adverse effect of sulfonamides, initial encounter: Secondary | ICD-10-CM | POA: Diagnosis present

## 2016-03-24 DIAGNOSIS — Z889 Allergy status to unspecified drugs, medicaments and biological substances status: Secondary | ICD-10-CM

## 2016-03-24 LAB — COMPREHENSIVE METABOLIC PANEL
ALK PHOS: 60 U/L (ref 38–126)
ALT: 24 U/L (ref 17–63)
AST: 26 U/L (ref 15–41)
Albumin: 2.9 g/dL — ABNORMAL LOW (ref 3.5–5.0)
Anion gap: 5 (ref 5–15)
BUN: 14 mg/dL (ref 6–20)
CALCIUM: 8.6 mg/dL — AB (ref 8.9–10.3)
CO2: 24 mmol/L (ref 22–32)
CREATININE: 0.77 mg/dL (ref 0.61–1.24)
Chloride: 108 mmol/L (ref 101–111)
Glucose, Bld: 94 mg/dL (ref 65–99)
Potassium: 4.1 mmol/L (ref 3.5–5.1)
SODIUM: 137 mmol/L (ref 135–145)
TOTAL PROTEIN: 6.3 g/dL — AB (ref 6.5–8.1)
Total Bilirubin: 0.9 mg/dL (ref 0.3–1.2)

## 2016-03-24 LAB — CBC
HCT: 40.2 % (ref 39.0–52.0)
Hemoglobin: 13.6 g/dL (ref 13.0–17.0)
MCH: 34.2 pg — ABNORMAL HIGH (ref 26.0–34.0)
MCHC: 33.8 g/dL (ref 30.0–36.0)
MCV: 101 fL — AB (ref 78.0–100.0)
PLATELETS: 176 10*3/uL (ref 150–400)
RBC: 3.98 MIL/uL — ABNORMAL LOW (ref 4.22–5.81)
RDW: 12.7 % (ref 11.5–15.5)
WBC: 10.3 10*3/uL (ref 4.0–10.5)

## 2016-03-24 MED ORDER — ZOLPIDEM TARTRATE 5 MG PO TABS
5.0000 mg | ORAL_TABLET | Freq: Once | ORAL | Status: AC
  Administered 2016-03-24: 5 mg via ORAL
  Filled 2016-03-24: qty 1

## 2016-03-24 NOTE — Progress Notes (Signed)
PROGRESS NOTE    Richard Krueger:[id number] DOB: 05/02/1966 DOA: 03/23/2016 PCP: No PCP Per Patient   Brief Narrative:  Richard Krueger is a 50 year old gentleman with a past medical history of remote orthopedic surgery with postoperative staph infection, admitted to medicine service on 03/23/2016 when he presented with complaints of generalized weakness, malaise, fevers. He was recently found to have abscess of left thigh while he was in [place] on 03/17/2016, seen in the emergency department there where he underwent incision and drainage. He was discharged on Bactrim subsequently developed reaction. He stopped taking Bactrim.   Assessment & Plan:   Principal Problem:   Allergic reaction caused by a drug Active Problems:   Leukocytosis   Thigh abscess   Dehydration, moderate   Tobacco abuse   ?? Enteritis   Abscess, gluteal, left  1.  Left buttock abscess -He recently underwent incision and drainage on 03/17/2016 at an emergency department in [place]. He had been discharged on Bactrim however stopped this medication early due to drug reaction. -Initial labs revealing a white count of 15,400, trending down to 10,300 on a.m. lab work. -Blood cultures obtained on admission showing no growth x2  -Gen. surgery was consulted, did not recommend incision and drainage -Continue IV antibiotic therapy with vancomycin, stop Zosyn  2.  Drug reaction/Sulfa Allergy -He was recently seen at an emergency department in [place], prescribed Bactrim for treatment of an abscess.  -After starting Bactrim. Reported developing malaise, myalgias, with development of rash. -Labs showing CK of 149 -On physical examination has resolving rash. -Continue supportive care   DVT prophylaxis: Lovenox Code Status: Full code Family Communication:  Disposition Plan: Continue IV antibiotic therapy  Consultants:   General surgery  Antimicrobials:   Vancomycin  Subjective: Reports feeling better today,  denies nausea, vomiting, fevers, chills  Objective: Vitals:   03/23/16 1437 03/23/16 2146 03/24/16 0546 03/24/16 1505  BP: (!) 126/51 109/63 109/61 112/72  Pulse: 68 (!) 55 68 66  Resp: 20 18 16 16   Temp: 97.3 F (36.3 C) 98.2 F (36.8 C) 98.3 F (36.8 C) 98.7 F (37.1 C)  TempSrc: Oral  Oral   SpO2: 98% 96% 100% 100%  Weight:      Height:        Intake/Output Summary (Last 24 hours) at 03/24/16 1711 Last data filed at 03/24/16 1500  Gross per 24 hour  Intake          1586.25 ml  Output                0 ml  Net          1586.25 ml   Filed Weights   03/23/16 0149  Weight: 86.2 kg (190 lb)    Examination:  General exam: Awake and alert, nontoxic-appearing Respiratory system: Clear to auscultation. Respiratory effort normal. Cardiovascular system: S1 & S2 heard, RRR. No JVD, murmurs, rubs, gallops or clicks. No pedal edema. Gastrointestinal system: Abdomen is nondistended, soft and nontender. No organomegaly or masses felt. Normal bowel sounds heard. Central nervous system: Alert and oriented. No focal neurological deficits. Extremities: Symmetric 5 x 5 power. Skin: There is a 3 cm draining abscess over the left buttock region having surrounding induration and erythema, there is also a quarter-sized lesion over right medial thigh indurated with associated erythema, without evidence of urinary drainage. There is also bilateral lower extremity maculopapular rash Psychiatry: Judgement and insight appear normal. Mood & affect appropriate.     Data Reviewed: I have  personally reviewed following labs and imaging studies  CBC:  Recent Labs Lab 03/23/16 0159 03/24/16 0543  WBC 15.4* 10.3  NEUTROABS 8.7*  --   HGB 17.0 13.6  HCT 47.7 40.2  MCV 100.2* 101.0*  PLT 241 176   Basic Metabolic Panel:  Recent Labs Lab 03/23/16 0159 03/24/16 0543  NA 134* 137  K 4.1 4.1  CL 101 108  CO2 25 24  GLUCOSE 108* 94  BUN 21* 14  CREATININE 0.94 0.77  CALCIUM 9.6 8.6*    GFR: Estimated Creatinine Clearance: 121.5 mL/min (by C-G formula based on SCr of 0.8 mg/dL). Liver Function Tests:  Recent Labs Lab 03/23/16 0159 03/24/16 0543  AST 32 26  ALT 30 24  ALKPHOS 84 60  BILITOT 0.9 0.9  PROT 8.8* 6.3*  ALBUMIN 4.0 2.9*   No results for input(s): LIPASE, AMYLASE in the last 168 hours. No results for input(s): AMMONIA in the last 168 hours. Coagulation Profile: No results for input(s): INR, PROTIME in the last 168 hours. Cardiac Enzymes:  Recent Labs Lab 03/23/16 0159  CKTOTAL 149  CKMB 3.4   BNP (last 3 results) No results for input(s): PROBNP in the last 8760 hours. HbA1C: No results for input(s): HGBA1C in the last 72 hours. CBG: No results for input(s): GLUCAP in the last 168 hours. Lipid Profile: No results for input(s): CHOL, HDL, LDLCALC, TRIG, CHOLHDL, LDLDIRECT in the last 72 hours. Thyroid Function Tests: No results for input(s): TSH, T4TOTAL, FREET4, T3FREE, THYROIDAB in the last 72 hours. Anemia Panel: No results for input(s): VITAMINB12, FOLATE, FERRITIN, TIBC, IRON, RETICCTPCT in the last 72 hours. Sepsis Labs: No results for input(s): PROCALCITON, LATICACIDVEN in the last 168 hours.  Recent Results (from the past 240 hour(s))  Culture, blood (Routine X 2) w Reflex to ID Panel     Status: None (Preliminary result)   Collection Time: 03/23/16  7:55 AM  Result Value Ref Range Status   Specimen Description BLOOD LEFT FOREARM  Final   Special Requests BOTTLES DRAWN AEROBIC AND ANAEROBIC 5CC  Final   Culture NO GROWTH 1 DAY  Final   Report Status PENDING  Incomplete  Culture, blood (Routine X 2) w Reflex to ID Panel     Status: None (Preliminary result)   Collection Time: 03/23/16  8:00 AM  Result Value Ref Range Status   Specimen Description BLOOD RIGHT ANTECUBITAL  Final   Special Requests BOTTLES DRAWN AEROBIC AND ANAEROBIC 10CC  Final   Culture NO GROWTH 1 DAY  Final   Report Status PENDING  Incomplete          Radiology Studies: Ct Abdomen Pelvis W Contrast  Result Date: 03/23/2016 CLINICAL DATA:  Diffuse myalgias, rash. Low-grade fevers. Abscess on thigh. EXAM: CT ABDOMEN AND PELVIS WITH CONTRAST TECHNIQUE: Multidetector CT imaging of the abdomen and pelvis was performed using the standard protocol following bolus administration of intravenous contrast. CONTRAST:  100 cc Isovue-300 IV COMPARISON:  Plain films today. FINDINGS: Lower chest: Dependent atelectasis in the lung bases. Heart is normal size. No effusions. Hepatobiliary: No focal hepatic abnormality. Gallbladder unremarkable. Pancreas: No focal abnormality or ductal dilatation. Spleen: No focal abnormality.  Normal size. Adrenals/Urinary Tract: No adrenal abnormality. No focal renal abnormality. No stones or hydronephrosis. Urinary bladder is unremarkable. Stomach/Bowel: Appendix is visualized and is prominent, measuring 8 mm in diameter. Note surrounding inflammatory changes. Stomach, large and small bowel grossly unremarkable. Vascular/Lymphatic: Aortic and iliac calcifications. No aneurysm. Small retroperitoneal lymph nodes, the largest  index node in the left periaortic region measuring up to 10 mm short axis diameter. Small and borderline sized lymph nodes continues throughout the iliac chains and inguinal regions bilaterally. Reproductive: No visible focal abnormality. Other: No free fluid or free air. No visible soft tissue abnormality or abscess. Musculoskeletal: Hardware within the proximal left femur. No acute bony abnormality or focal bone lesion. IMPRESSION: Scattered shotty retroperitoneal, pelvic and inguinal lymph nodes, presumably reactive. These could be followed clinically within the inguinal regions and in the abdomen and pelvis with repeat CT in 6-12 months. Mildly prominent appendix, 8 mm in diameter. No visible inflammatory changes surrounding the appendix. Recommend clinical correlation to exclude early acute appendicitis.  Electronically Signed   By: Charlett NoseKevin  Dover M.D.   On: 03/23/2016 16:27   Dg Abdomen Acute W/chest  Result Date: 03/23/2016 CLINICAL DATA:  Allergic reaction to the antibiotic. Patient has lost muscle control of legs. Fever. EXAM: DG ABDOMEN ACUTE W/ 1V CHEST COMPARISON:  None. FINDINGS: Normal heart size and pulmonary vascularity. No focal airspace disease or consolidation in the lungs. No blunting of costophrenic angles. No pneumothorax. Mediastinal contours appear intact. Scattered gas and stool throughout the colon. Gas-filled nondilated left upper quadrant small bowel with apparent fold thickening. This may be due to ileus or enteritis. No free intra-abdominal air. No abnormal air-fluid levels. No radiopaque stones. Visualized bones appear intact. IMPRESSION: No evidence of active pulmonary disease. Nondilated gas-filled small bowel with fold thickening may indicate enteritis or ileus. No findings to suggest obstruction. Electronically Signed   By: Burman NievesWilliam  Stevens M.D.   On: 03/23/2016 06:33        Scheduled Meds: . enoxaparin (LOVENOX) injection  40 mg Subcutaneous Q24H  . ketorolac  15 mg Intravenous Q6H  . nicotine  21 mg Transdermal Daily  . piperacillin-tazobactam (ZOSYN)  IV  3.375 g Intravenous Q8H  . vancomycin  1,000 mg Intravenous Q12H   Continuous Infusions: . sodium chloride 75 mL/hr at 03/24/16 1045     LOS: 0 days    Time spent: 25 min    Jeralyn BennettZAMORA, Keene Gilkey, MD [facility] Pager [phone]  If 7PM-7AM, please contact night-coverage [email or web address] Password [email or web address] 03/24/2016, 5:11 PM

## 2016-03-24 NOTE — Progress Notes (Signed)
Patient ID: Richard Krueger, male   DOB: 02/10/1966, 49 y.o.   MRN: [id number] [facility] Surgery Progress Note:   * No surgery found *  Subjective: Mental status is clear.  Talkative.  Had reaction to Bactrim with cutaneous rash.  Has been on IV Vancocin since early Friday.   Objective: Vital signs in last 24 hours: Temp:  [97.3 F (36.3 C)-98.3 F (36.8 C)] 98.3 F (36.8 C) (08/19 0546) Pulse Rate:  [55-68] 68 (08/19 0546) Resp:  [16-20] 16 (08/19 0546) BP: (109-126)/(51-63) 109/61 (08/19 0546) SpO2:  [96 %-100 %] 100 % (08/19 0546)  Intake/Output from previous day: 08/18 0701 - 08/19 0700 In: 2106.3 [P.O.:120; I.V.:1736.3; IV Piggyback:250] Out: -  Intake/Output this shift: No intake/output data recorded.  Physical Exam: Work of breathing is normal.  Medial thigh there is a nontender eschar with no surrounding erythema.  On the left buttock there is a draining carbuncle.  The overlying skin has some exotoxin changes but I would refrain from excision at the present.  Would get in the shower and keep clean and continue IV Vancocin.    Lab Results:  Results for orders placed or performed during the hospital encounter of 03/23/16 (from the past 48 hour(s))  CBC with Differential     Status: Abnormal   Collection Time: 03/23/16  1:59 AM  Result Value Ref Range   WBC 15.4 (H) 4.0 - 10.5 K/uL   RBC 4.76 4.22 - 5.81 MIL/uL   Hemoglobin 17.0 13.0 - 17.0 g/dL   HCT 47.7 39.0 - 52.0 %   MCV 100.2 (H) 78.0 - 100.0 fL   MCH 35.7 (H) 26.0 - 34.0 pg   MCHC 35.6 30.0 - 36.0 g/dL   RDW 12.8 11.5 - 15.5 %   Platelets 241 150 - 400 K/uL   Neutrophils Relative % 57 %   Lymphocytes Relative 31 %   Monocytes Relative 9 %   Eosinophils Relative 3 %   Basophils Relative 0 %   Neutro Abs 8.7 (H) 1.7 - 7.7 K/uL   Lymphs Abs 4.8 (H) 0.7 - 4.0 K/uL   Monocytes Absolute 1.4 (H) 0.1 - 1.0 K/uL   Eosinophils Absolute 0.5 0.0 - 0.7 K/uL   Basophils Absolute 0.0 0.0 - 0.1 K/uL   WBC  Morphology TOXIC GRANULATION   Comprehensive metabolic panel     Status: Abnormal   Collection Time: 03/23/16  1:59 AM  Result Value Ref Range   Sodium 134 (L) 135 - 145 mmol/L   Potassium 4.1 3.5 - 5.1 mmol/L   Chloride 101 101 - 111 mmol/L   CO2 25 22 - 32 mmol/L   Glucose, Bld 108 (H) 65 - 99 mg/dL   BUN 21 (H) 6 - 20 mg/dL   Creatinine, Ser 0.94 0.61 - 1.24 mg/dL   Calcium 9.6 8.9 - 10.3 mg/dL   Total Protein 8.8 (H) 6.5 - 8.1 g/dL   Albumin 4.0 3.5 - 5.0 g/dL   AST 32 15 - 41 U/L   ALT 30 17 - 63 U/L   Alkaline Phosphatase 84 38 - 126 U/L   Total Bilirubin 0.9 0.3 - 1.2 mg/dL   GFR calc non Af Amer >60 >60 mL/min   GFR calc Af Amer >60 >60 mL/min    Comment: (NOTE) The eGFR has been calculated using the CKD EPI equation. This calculation has not been validated in all clinical situations. eGFR's persistently <60 mL/min signify possible Chronic Kidney Disease.    Anion gap  8 5 - 15  CK total and CKMB (cardiac)not at [facility]     Status: None   Collection Time: 03/23/16  1:59 AM  Result Value Ref Range   Total CK 149 49 - 397 U/L   CK, MB 3.4 0.5 - 5.0 ng/mL   Relative Index 2.3 0.0 - 2.5  Urinalysis, Routine w reflex microscopic (not at [facility])     Status: Abnormal   Collection Time: 03/23/16  2:00 AM  Result Value Ref Range   Color, Urine AMBER (A) YELLOW    Comment: BIOCHEMICALS MAY BE AFFECTED BY COLOR   APPearance CLOUDY (A) CLEAR   Specific Gravity, Urine 1.036 (H) 1.005 - 1.030   pH 5.5 5.0 - 8.0   Glucose, UA NEGATIVE NEGATIVE mg/dL   Hgb urine dipstick SMALL (A) NEGATIVE   Bilirubin Urine SMALL (A) NEGATIVE   Ketones, ur NEGATIVE NEGATIVE mg/dL   Protein, ur NEGATIVE NEGATIVE mg/dL   Nitrite NEGATIVE NEGATIVE   Leukocytes, UA NEGATIVE NEGATIVE  Urine microscopic-add on     Status: Abnormal   Collection Time: 03/23/16  2:00 AM  Result Value Ref Range   Squamous Epithelial / LPF 0-5 (A) NONE SEEN   WBC, UA NONE SEEN 0 - 5 WBC/hpf   RBC / HPF 6-30 0 - 5 RBC/hpf    Bacteria, UA RARE (A) NONE SEEN   Casts HYALINE CASTS (A) NEGATIVE   Urine-Other MUCOUS PRESENT   Sedimentation rate     Status: None   Collection Time: 03/23/16  8:00 AM  Result Value Ref Range   Sed Rate 14 0 - 16 mm/hr  C-reactive protein     Status: Abnormal   Collection Time: 03/23/16  8:00 AM  Result Value Ref Range   CRP 3.3 (H) <1.0 mg/dL  Lactate dehydrogenase     Status: None   Collection Time: 03/23/16  8:00 AM  Result Value Ref Range   LDH 140 98 - 192 U/L  HIV antibody     Status: None   Collection Time: 03/23/16  8:00 AM  Result Value Ref Range   HIV Screen 4th Generation wRfx Non Reactive Non Reactive    Comment: (NOTE) Performed At: [facility] [place], [place] [id number] Lindon Romp MD [id number]   Comprehensive metabolic panel     Status: Abnormal   Collection Time: 03/24/16  5:43 AM  Result Value Ref Range   Sodium 137 135 - 145 mmol/L   Potassium 4.1 3.5 - 5.1 mmol/L   Chloride 108 101 - 111 mmol/L   CO2 24 22 - 32 mmol/L   Glucose, Bld 94 65 - 99 mg/dL   BUN 14 6 - 20 mg/dL   Creatinine, Ser 0.77 0.61 - 1.24 mg/dL   Calcium 8.6 (L) 8.9 - 10.3 mg/dL   Total Protein 6.3 (L) 6.5 - 8.1 g/dL   Albumin 2.9 (L) 3.5 - 5.0 g/dL   AST 26 15 - 41 U/L   ALT 24 17 - 63 U/L   Alkaline Phosphatase 60 38 - 126 U/L   Total Bilirubin 0.9 0.3 - 1.2 mg/dL   GFR calc non Af Amer >60 >60 mL/min   GFR calc Af Amer >60 >60 mL/min    Comment: (NOTE) The eGFR has been calculated using the CKD EPI equation. This calculation has not been validated in all clinical situations. eGFR's persistently <60 mL/min signify possible Chronic Kidney Disease.    Anion gap 5 5 - 15  CBC  Status: Abnormal   Collection Time: 03/24/16  5:43 AM  Result Value Ref Range   WBC 10.3 4.0 - 10.5 K/uL   RBC 3.98 (L) 4.22 - 5.81 MIL/uL   Hemoglobin 13.6 13.0 - 17.0 g/dL   HCT 40.2 39.0 - 52.0 %   MCV 101.0 (H) 78.0 - 100.0 fL   MCH 34.2 (H) 26.0 - 34.0 pg    MCHC 33.8 30.0 - 36.0 g/dL   RDW 12.7 11.5 - 15.5 %   Platelets 176 150 - 400 K/uL    Radiology/Results: Ct Abdomen Pelvis W Contrast  Result Date: 03/23/2016 CLINICAL DATA:  Diffuse myalgias, rash. Low-grade fevers. Abscess on thigh. EXAM: CT ABDOMEN AND PELVIS WITH CONTRAST TECHNIQUE: Multidetector CT imaging of the abdomen and pelvis was performed using the standard protocol following bolus administration of intravenous contrast. CONTRAST:  100 cc Isovue-300 IV COMPARISON:  Plain films today. FINDINGS: Lower chest: Dependent atelectasis in the lung bases. Heart is normal size. No effusions. Hepatobiliary: No focal hepatic abnormality. Gallbladder unremarkable. Pancreas: No focal abnormality or ductal dilatation. Spleen: No focal abnormality.  Normal size. Adrenals/Urinary Tract: No adrenal abnormality. No focal renal abnormality. No stones or hydronephrosis. Urinary bladder is unremarkable. Stomach/Bowel: Appendix is visualized and is prominent, measuring 8 mm in diameter. Note surrounding inflammatory changes. Stomach, large and small bowel grossly unremarkable. Vascular/Lymphatic: Aortic and iliac calcifications. No aneurysm. Small retroperitoneal lymph nodes, the largest index node in the left periaortic region measuring up to 10 mm short axis diameter. Small and borderline sized lymph nodes continues throughout the iliac chains and inguinal regions bilaterally. Reproductive: No visible focal abnormality. Other: No free fluid or free air. No visible soft tissue abnormality or abscess. Musculoskeletal: Hardware within the proximal left femur. No acute bony abnormality or focal bone lesion. IMPRESSION: Scattered shotty retroperitoneal, pelvic and inguinal lymph nodes, presumably reactive. These could be followed clinically within the inguinal regions and in the abdomen and pelvis with repeat CT in 6-12 months. Mildly prominent appendix, 8 mm in diameter. No visible inflammatory changes surrounding the  appendix. Recommend clinical correlation to exclude early acute appendicitis. Electronically Signed   By: Rolm Baptise M.D.   On: 03/23/2016 16:27   Dg Abdomen Acute W/chest  Result Date: 03/23/2016 CLINICAL DATA:  Allergic reaction to the antibiotic. Patient has lost muscle control of legs. Fever. EXAM: DG ABDOMEN ACUTE W/ 1V CHEST COMPARISON:  None. FINDINGS: Normal heart size and pulmonary vascularity. No focal airspace disease or consolidation in the lungs. No blunting of costophrenic angles. No pneumothorax. Mediastinal contours appear intact. Scattered gas and stool throughout the colon. Gas-filled nondilated left upper quadrant small bowel with apparent fold thickening. This may be due to ileus or enteritis. No free intra-abdominal air. No abnormal air-fluid levels. No radiopaque stones. Visualized bones appear intact. IMPRESSION: No evidence of active pulmonary disease. Nondilated gas-filled small bowel with fold thickening may indicate enteritis or ileus. No findings to suggest obstruction. Electronically Signed   By: Lucienne Capers M.D.   On: 03/23/2016 06:33    Anti-infectives: Anti-infectives    Start     Dose/Rate Route Frequency Ordered Stop   03/23/16 2000  vancomycin (VANCOCIN) IVPB 1000 mg/200 mL premix     1,000 mg 200 mL/hr over 60 Minutes Intravenous Every 12 hours 03/23/16 0747     03/23/16 1500  piperacillin-tazobactam (ZOSYN) IVPB 3.375 g     3.375 g 12.5 mL/hr over 240 Minutes Intravenous Every 8 hours 03/23/16 1439     03/23/16 0800  vancomycin (VANCOCIN) 1,500 mg in sodium chloride 0.9 % 500 mL IVPB     1,500 mg 250 mL/hr over 120 Minutes Intravenous  Once 03/23/16 0746 03/23/16 1004      Assessment/Plan: Problem List: Patient Active Problem List   Diagnosis Date Noted  . Allergic reaction caused by a drug 03/23/2016  . Leukocytosis 03/23/2016  . Thigh abscess 03/23/2016  . Dehydration, moderate 03/23/2016  . Tobacco abuse 03/23/2016  . ?? Enteritis  03/23/2016  . Abscess, gluteal, left 03/23/2016    Continue IV Vancocin for carbuncle of left buttock/post thigh * No surgery found *    LOS: 0 days   Matt B. Hassell Done, MD, [facility] [phone] beeper [phone]  03/24/2016 12:30 PM

## 2016-03-24 NOTE — Progress Notes (Signed)
Pt. Refusing bed alarm. Has been educated. Will continue to monitor.

## 2016-03-25 LAB — BASIC METABOLIC PANEL
ANION GAP: 10 (ref 5–15)
BUN: 14 mg/dL (ref 6–20)
CALCIUM: 8.8 mg/dL — AB (ref 8.9–10.3)
CO2: 25 mmol/L (ref 22–32)
Chloride: 106 mmol/L (ref 101–111)
Creatinine, Ser: 0.8 mg/dL (ref 0.61–1.24)
GFR calc Af Amer: >60 mL/min (ref >60)
Glucose, Bld: 90 mg/dL (ref 65–99)
POTASSIUM: 3.9 mmol/L (ref 3.5–5.1)
SODIUM: 141 mmol/L (ref 135–145)

## 2016-03-25 LAB — CBC
HEMATOCRIT: 42.6 % (ref 39.0–52.0)
HEMOGLOBIN: 14.2 g/dL (ref 13.0–17.0)
MCH: 33.7 pg (ref 26.0–34.0)
MCHC: 33.3 g/dL (ref 30.0–36.0)
MCV: 101.2 fL — ABNORMAL HIGH (ref 78.0–100.0)
Platelets: 199 10*3/uL (ref 150–400)
RBC: 4.21 MIL/uL — ABNORMAL LOW (ref 4.22–5.81)
RDW: 12.9 % (ref 11.5–15.5)
WBC: 10.3 10*3/uL (ref 4.0–10.5)

## 2016-03-25 LAB — VANCOMYCIN, TROUGH: Vancomycin Tr: 6 ug/mL — ABNORMAL LOW (ref 15–20)

## 2016-03-25 MED ORDER — ZOLPIDEM TARTRATE 5 MG PO TABS
5.0000 mg | ORAL_TABLET | Freq: Once | ORAL | Status: AC
  Administered 2016-03-25: 5 mg via ORAL
  Filled 2016-03-25: qty 1

## 2016-03-25 MED ORDER — GI COCKTAIL ~~LOC~~
30.0000 mL | Freq: Once | ORAL | Status: AC
  Administered 2016-03-25: 30 mL via ORAL
  Filled 2016-03-25: qty 30

## 2016-03-25 NOTE — Progress Notes (Signed)
Subjective:  Doing well, sites still sore hurts to sit on it. No other complaints.  Objective: Vital signs in last 24 hours: Temp:  [98.4 F (36.9 C)-98.7 F (37.1 C)] 98.4 F (36.9 C) (08/20 0527) Pulse Rate:  [59-66] 59 (08/20 0527) Resp:  [16-18] 18 (08/20 0527) BP: (112-141)/(72-77) 123/77 (08/20 0527) SpO2:  [96 %-100 %] 96 % (08/20 0527) Last BM Date: 03/24/16 Afebrile vital signs are stable WBC stable 10.3 CT 03/23/16 did not show any deep fluid collections.  Intake/Output from previous day: 08/19 0701 - 08/20 0700 In: 600 [P.O.:600] Out: -  Intake/Output this shift: No intake/output data recorded.  General appearance: alert, cooperative and no distress Skin: Site is still little red and erythematous with some purulence in the midportion.  Lab Results:   Recent Labs  03/24/16 0543 03/25/16 0515  WBC 10.3 10.3  HGB 13.6 14.2  HCT 40.2 42.6  PLT 176 199    BMET  Recent Labs  03/24/16 0543 03/25/16 0515  NA 137 141  K 4.1 3.9  CL 108 106  CO2 24 25  GLUCOSE 94 90  BUN 14 14  CREATININE 0.77 0.80  CALCIUM 8.6* 8.8*   PT/INR No results for input(s): LABPROT, INR in the last 72 hours.   Recent Labs Lab 03/23/16 0159 03/24/16 0543  AST 32 26  ALT 30 24  ALKPHOS 84 60  BILITOT 0.9 0.9  PROT 8.8* 6.3*  ALBUMIN 4.0 2.9*     Lipase  No results found for: LIPASE   Studies/Results: Ct Abdomen Pelvis W Contrast  Result Date: 03/23/2016 CLINICAL DATA:  Diffuse myalgias, rash. Low-grade fevers. Abscess on thigh. EXAM: CT ABDOMEN AND PELVIS WITH CONTRAST TECHNIQUE: Multidetector CT imaging of the abdomen and pelvis was performed using the standard protocol following bolus administration of intravenous contrast. CONTRAST:  100 cc Isovue-300 IV COMPARISON:  Plain films today. FINDINGS: Lower chest: Dependent atelectasis in the lung bases. Heart is normal size. No effusions. Hepatobiliary: No focal hepatic abnormality. Gallbladder unremarkable.  Pancreas: No focal abnormality or ductal dilatation. Spleen: No focal abnormality.  Normal size. Adrenals/Urinary Tract: No adrenal abnormality. No focal renal abnormality. No stones or hydronephrosis. Urinary bladder is unremarkable. Stomach/Bowel: Appendix is visualized and is prominent, measuring 8 mm in diameter. Note surrounding inflammatory changes. Stomach, large and small bowel grossly unremarkable. Vascular/Lymphatic: Aortic and iliac calcifications. No aneurysm. Small retroperitoneal lymph nodes, the largest index node in the left periaortic region measuring up to 10 mm short axis diameter. Small and borderline sized lymph nodes continues throughout the iliac chains and inguinal regions bilaterally. Reproductive: No visible focal abnormality. Other: No free fluid or free air. No visible soft tissue abnormality or abscess. Musculoskeletal: Hardware within the proximal left femur. No acute bony abnormality or focal bone lesion. IMPRESSION: Scattered shotty retroperitoneal, pelvic and inguinal lymph nodes, presumably reactive. These could be followed clinically within the inguinal regions and in the abdomen and pelvis with repeat CT in 6-12 months. Mildly prominent appendix, 8 mm in diameter. No visible inflammatory changes surrounding the appendix. Recommend clinical correlation to exclude early acute appendicitis. Electronically Signed   By: Charlett NoseKevin  Dover M.D.   On: 03/23/2016 16:27    Medications: . enoxaparin (LOVENOX) injection  40 mg Subcutaneous Q24H  . ketorolac  15 mg Intravenous Q6H  . nicotine  21 mg Transdermal Daily  . vancomycin  1,000 mg Intravenous Q12H   . sodium chloride 75 mL/hr at 03/25/16 0536    Assessment/Plan S/p I&D  Gluteal  abscess 03/17/16 -  CT scan no abscess/fluid  found Allergic drug reaction FEN:  Regular diet/IV fluids ZOX:[id number]:Lovenox ID: day 3 vancomycin and 2 days of Zosyn- no cultures   Plan: Continue current meds, add some local wound care and heat to the  site..    LOS: 1 day    JENNINGS,WILLARD 03/25/2016 [phone] I have seen and examined this patient and agree with the assessment and plan.   Matt B. Daphine DeutscherMartin, MD, [facility]  [facility] [phone] beeper [phone]  03/25/2016 10:31 AM

## 2016-03-25 NOTE — Progress Notes (Signed)
PROGRESS NOTE    Hayward Rylander  ZOX:[id number] DOB: [birth year] DOA: 03/23/2016 PCP: No PCP Per Patient   Brief Narrative:  Mr Richard Krueger is a 50 year old gentleman with a past medical history of remote orthopedic surgery with postoperative staph infection, admitted to medicine service on 03/23/2016 when he presented with complaints of generalized weakness, malaise, fevers. He was recently found to have abscess of left thigh while he was in [place] on 03/17/2016, seen in the emergency department there where he underwent incision and drainage. He was discharged on Bactrim subsequently developed reaction. He stopped taking Bactrim.   Assessment & Plan:   Principal Problem:   Allergic reaction caused by a drug Active Problems:   Leukocytosis   Thigh abscess   Dehydration, moderate   Tobacco abuse   ?? Enteritis   Abscess, gluteal, left  1.  Left buttock abscess -He recently underwent incision and drainage on 03/17/2016 at an emergency department in [place]. He had been discharged on Bactrim however stopped this medication early due to drug reaction. -Initial labs revealing a white count of 15,400, trending down to 10,300 on a.m. lab work. -Blood cultures obtained on admission showing no growth x2  -Gen. surgery was consulted, did not recommend incision and drainage -We received wound culture from [facility] dated 03/17/2016 revealed MRSA, organism susceptible to Cipro, Gent, Linezolid, Levaquin, Rifampin, Bactrim, and Vancomycin. Will continue Vancomycin IV.   2.  Drug reaction/Sulfa Allergy -He was recently seen at an emergency department in [place], prescribed Bactrim for treatment of an abscess.  -After starting Bactrim. Reported developing malaise, myalgias, with development of rash. -Labs showing CK of 149 -On physical examination has resolving rash. -Continue supportive care   DVT prophylaxis: Lovenox Code Status: Full code Family Communication:  Disposition Plan:  Continue IV antibiotic therapy  Consultants:   General surgery  Antimicrobials:   Vancomycin  Subjective: Overall reports feeling better, ambulating in the hallway.   Objective: Vitals:   03/24/16 0546 03/24/16 1505 03/24/16 2155 03/25/16 0527  BP: 109/61 112/72 (!) 141/72 123/77  Pulse: 68 66 65 (!) 59  Resp: 16 16 18 18   Temp: 98.3 F (36.8 C) 98.7 F (37.1 C) 98.7 F (37.1 C) 98.4 F (36.9 C)  TempSrc: Oral     SpO2: 100% 100% 99% 96%  Weight:      Height:        Intake/Output Summary (Last 24 hours) at 03/25/16 1330 Last data filed at 03/24/16 1500  Gross per 24 hour  Intake              600 ml  Output                0 ml  Net              600 ml   Filed Weights   03/23/16 0149  Weight: 86.2 kg (190 lb)    Examination:  General exam: Awake and alert, nontoxic-appearing Respiratory system: Clear to auscultation. Respiratory effort normal. Cardiovascular system: S1 & S2 heard, RRR. No JVD, murmurs, rubs, gallops or clicks. No pedal edema. Gastrointestinal system: Abdomen is nondistended, soft and nontender. No organomegaly or masses felt. Normal bowel sounds heard. Central nervous system: Alert and oriented. No focal neurological deficits. Extremities: Symmetric 5 x 5 power. Skin: There is a 3 cm draining abscess over the left buttock region having surrounding induration and erythema, there is also a quarter-sized lesion over right medial thigh indurated with associated erythema, without evidence of  urinary drainage. There is also bilateral lower extremity maculopapular rash Psychiatry: Judgement and insight appear normal. Mood & affect appropriate.     Data Reviewed: I have personally reviewed following labs and imaging studies  CBC:  Recent Labs Lab 03/23/16 0159 03/24/16 0543 03/25/16 0515  WBC 15.4* 10.3 10.3  NEUTROABS 8.7*  --   --   HGB 17.0 13.6 14.2  HCT 47.7 40.2 42.6  MCV 100.2* 101.0* 101.2*  PLT 241 176 199   Basic Metabolic  Panel:  Recent Labs Lab 03/23/16 0159 03/24/16 0543 03/25/16 0515  NA 134* 137 141  K 4.1 4.1 3.9  CL 101 108 106  CO2 25 24 25   GLUCOSE 108* 94 90  BUN 21* 14 14  CREATININE 0.94 0.77 0.80  CALCIUM 9.6 8.6* 8.8*   GFR: Estimated Creatinine Clearance: 121.5 mL/min (by C-G formula based on SCr of 0.8 mg/dL). Liver Function Tests:  Recent Labs Lab 03/23/16 0159 03/24/16 0543  AST 32 26  ALT 30 24  ALKPHOS 84 60  BILITOT 0.9 0.9  PROT 8.8* 6.3*  ALBUMIN 4.0 2.9*   No results for input(s): LIPASE, AMYLASE in the last 168 hours. No results for input(s): AMMONIA in the last 168 hours. Coagulation Profile: No results for input(s): INR, PROTIME in the last 168 hours. Cardiac Enzymes:  Recent Labs Lab 03/23/16 0159  CKTOTAL 149  CKMB 3.4   BNP (last 3 results) No results for input(s): PROBNP in the last 8760 hours. HbA1C: No results for input(s): HGBA1C in the last 72 hours. CBG: No results for input(s): GLUCAP in the last 168 hours. Lipid Profile: No results for input(s): CHOL, HDL, LDLCALC, TRIG, CHOLHDL, LDLDIRECT in the last 72 hours. Thyroid Function Tests: No results for input(s): TSH, T4TOTAL, FREET4, T3FREE, THYROIDAB in the last 72 hours. Anemia Panel: No results for input(s): VITAMINB12, FOLATE, FERRITIN, TIBC, IRON, RETICCTPCT in the last 72 hours. Sepsis Labs: No results for input(s): PROCALCITON, LATICACIDVEN in the last 168 hours.  Recent Results (from the past 240 hour(s))  Culture, blood (Routine X 2) w Reflex to ID Panel     Status: None (Preliminary result)   Collection Time: 03/23/16  7:55 AM  Result Value Ref Range Status   Specimen Description BLOOD LEFT FOREARM  Final   Special Requests BOTTLES DRAWN AEROBIC AND ANAEROBIC 5CC  Final   Culture NO GROWTH 1 DAY  Final   Report Status PENDING  Incomplete  Culture, blood (Routine X 2) w Reflex to ID Panel     Status: None (Preliminary result)   Collection Time: 03/23/16  8:00 AM  Result  Value Ref Range Status   Specimen Description BLOOD RIGHT ANTECUBITAL  Final   Special Requests BOTTLES DRAWN AEROBIC AND ANAEROBIC 10CC  Final   Culture NO GROWTH 1 DAY  Final   Report Status PENDING  Incomplete         Radiology Studies: Ct Abdomen Pelvis W Contrast  Result Date: 03/23/2016 CLINICAL DATA:  Diffuse myalgias, rash. Low-grade fevers. Abscess on thigh. EXAM: CT ABDOMEN AND PELVIS WITH CONTRAST TECHNIQUE: Multidetector CT imaging of the abdomen and pelvis was performed using the standard protocol following bolus administration of intravenous contrast. CONTRAST:  100 cc Isovue-300 IV COMPARISON:  Plain films today. FINDINGS: Lower chest: Dependent atelectasis in the lung bases. Heart is normal size. No effusions. Hepatobiliary: No focal hepatic abnormality. Gallbladder unremarkable. Pancreas: No focal abnormality or ductal dilatation. Spleen: No focal abnormality.  Normal size. Adrenals/Urinary Tract: No adrenal abnormality.  No focal renal abnormality. No stones or hydronephrosis. Urinary bladder is unremarkable. Stomach/Bowel: Appendix is visualized and is prominent, measuring 8 mm in diameter. Note surrounding inflammatory changes. Stomach, large and small bowel grossly unremarkable. Vascular/Lymphatic: Aortic and iliac calcifications. No aneurysm. Small retroperitoneal lymph nodes, the largest index node in the left periaortic region measuring up to 10 mm short axis diameter. Small and borderline sized lymph nodes continues throughout the iliac chains and inguinal regions bilaterally. Reproductive: No visible focal abnormality. Other: No free fluid or free air. No visible soft tissue abnormality or abscess. Musculoskeletal: Hardware within the proximal left femur. No acute bony abnormality or focal bone lesion. IMPRESSION: Scattered shotty retroperitoneal, pelvic and inguinal lymph nodes, presumably reactive. These could be followed clinically within the inguinal regions and in the  abdomen and pelvis with repeat CT in 6-12 months. Mildly prominent appendix, 8 mm in diameter. No visible inflammatory changes surrounding the appendix. Recommend clinical correlation to exclude early acute appendicitis. Electronically Signed   By: Charlett NoseKevin  Dover M.D.   On: 03/23/2016 16:27      Scheduled Meds: . enoxaparin (LOVENOX) injection  40 mg Subcutaneous Q24H  . ketorolac  15 mg Intravenous Q6H  . nicotine  21 mg Transdermal Daily  . vancomycin  1,000 mg Intravenous Q12H   Continuous Infusions: . sodium chloride 75 mL/hr at 03/25/16 0536     LOS: 1 day    Time spent: 25 min    Jeralyn BennettZAMORA, Keontay Vora, MD [facility] Pager [phone]  If 7PM-7AM, please contact night-coverage [email or web address] Password [email or web address] 03/25/2016, 1:30 PM

## 2016-03-25 NOTE — Progress Notes (Addendum)
Pharmacy Antibiotic Note  Richard Krueger is a 50 y.o. male admitted on 03/23/2016 with cellulitis.  Pharmacy has been consulted for vancomycin dosing.  Pt had abscess drained in [facility]  8/12 (left upper leg at the crease of buttock).  Vancomycin continues for Left buttock abscess (MRSA per [facility]).  Cultures here ngtd.  -Vancomycin trough= 6 at ~ 7pm (last dose given at 8am)  Plan:  -Change vancomycin to 1250mg  IV q8h -Recheck a vancomycin level at steady state  Height: 5\' 9"  (175.3 cm) Weight: 190 lb (86.2 kg) IBW/kg (Calculated) : 70.7  Temp (24hrs), Avg:98.3 F (36.8 C), Min:97.8 F (36.6 C), Max:98.7 F (37.1 C)   Recent Labs Lab 03/23/16 0159 03/24/16 0543 03/25/16 0515 03/25/16 1914  WBC 15.4* 10.3 10.3  --   CREATININE 0.94 0.77 0.80  --   VANCOTROUGH  --   --   --  6*    Estimated Creatinine Clearance: 121.5 mL/min (by C-G formula based on SCr of 0.8 mg/dL).    Allergies  Allergen Reactions  . Sulfa Antibiotics Other (See Comments)    Swollen muscles, stiff joints and break out    Antimicrobials this admission: vanc 8/18>>   Microbiology results: 8/18 BCx2>> ngtd HIV>> non-reactive  Thank you for allowing pharmacy to be a part of this patient's care.  Harland GermanAndrew Krueger, Pharm D 03/25/2016 8:14 PM      Dose change not documented on 03/25/16, will place new order to start today at 1400.  Richard Krueger, PharmD Clinical Pharmacist Pager: [phone] 03/26/2016 11:16 AM

## 2016-03-26 MED ORDER — HYDROCERIN EX CREA
TOPICAL_CREAM | Freq: Two times a day (BID) | CUTANEOUS | Status: DC
  Administered 2016-03-26 – 2016-03-27 (×3): via TOPICAL
  Filled 2016-03-26: qty 113

## 2016-03-26 MED ORDER — IOPAMIDOL (ISOVUE-300) INJECTION 61%
100.0000 mL | Freq: Once | INTRAVENOUS | Status: AC | PRN
  Administered 2016-03-23: 100 mL via INTRAVENOUS

## 2016-03-26 MED ORDER — ZOLPIDEM TARTRATE 5 MG PO TABS
10.0000 mg | ORAL_TABLET | Freq: Every evening | ORAL | Status: DC | PRN
  Administered 2016-03-26: 10 mg via ORAL
  Filled 2016-03-26: qty 2

## 2016-03-26 MED ORDER — VANCOMYCIN HCL 10 G IV SOLR
1250.0000 mg | Freq: Three times a day (TID) | INTRAVENOUS | Status: DC
  Administered 2016-03-26 – 2016-03-27 (×4): 1250 mg via INTRAVENOUS
  Filled 2016-03-26 (×6): qty 1250

## 2016-03-26 NOTE — Progress Notes (Signed)
Patient ID: Richard Krueger, male   DOB: 1966/01/16, 50 y.o.   MRN: [id number]  [facility]     Subjective: Feeling better today. Myalgias resolving, has been walking the halls all day. Left proximal/posterior thigh abscess better but still sore. Complaining of BLE rash itching.  Objective: Vital signs in last 24 hours: Temp:  [97.7 F (36.5 C)-98.7 F (37.1 C)] 98.2 F (36.8 C) (08/21 1120) Pulse Rate:  [59-72] 70 (08/21 1122) Resp:  [14-18] 14 (08/21 1120) BP: (125-161)/(67-93) 139/74 (08/21 1122) SpO2:  [92 %-99 %] 99 % (08/21 1120) Last BM Date: 03/25/16  Intake/Output from previous day: 08/20 0701 - 08/21 0700 In: 4616.3 [P.O.:240; I.V.:3576.3; IV Piggyback:800] Out: -  Intake/Output this shift: Total I/O In: 440 [P.O.:240; IV Piggyback:200] Out: -   PE: Gen:  Alert, NAD Pulm:  CTAB Abd: Soft, NT/ND, +BS Skin: left proximal/posterior abscess still slightly erythematous with some purulence in the midportion  Lab Results:   Recent Labs  03/24/16 0543 03/25/16 0515  WBC 10.3 10.3  HGB 13.6 14.2  HCT 40.2 42.6  PLT 176 199   BMET  Recent Labs  03/24/16 0543 03/25/16 0515  NA 137 141  K 4.1 3.9  CL 108 106  CO2 24 25  GLUCOSE 94 90  BUN 14 14  CREATININE 0.77 0.80  CALCIUM 8.6* 8.8*   PT/INR No results for input(s): LABPROT, INR in the last 72 hours. CMP     Component Value Date/Time   NA 141 03/25/2016 0515   K 3.9 03/25/2016 0515   CL 106 03/25/2016 0515   CO2 25 03/25/2016 0515   GLUCOSE 90 03/25/2016 0515   BUN 14 03/25/2016 0515   CREATININE 0.80 03/25/2016 0515   CALCIUM 8.8 (L) 03/25/2016 0515   PROT 6.3 (L) 03/24/2016 0543   ALBUMIN 2.9 (L) 03/24/2016 0543   AST 26 03/24/2016 0543   ALT 24 03/24/2016 0543   ALKPHOS 60 03/24/2016 0543   BILITOT 0.9 03/24/2016 0543   GFRNONAA >60 03/25/2016 0515   GFRAA >60 03/25/2016 0515   Lipase  No results found for: LIPASE     Studies/Results: No  results found.  Anti-infectives: Anti-infectives    Start     Dose/Rate Route Frequency Ordered Stop   03/26/16 1400  vancomycin (VANCOCIN) 1,250 mg in sodium chloride 0.9 % 250 mL IVPB     1,250 mg 166.7 mL/hr over 90 Minutes Intravenous Every 8 hours 03/26/16 1114     03/23/16 2000  vancomycin (VANCOCIN) IVPB 1000 mg/200 mL premix  Status:  Discontinued     1,000 mg 200 mL/hr over 60 Minutes Intravenous Every 12 hours 03/23/16 0747 03/26/16 1114   03/23/16 1500  piperacillin-tazobactam (ZOSYN) IVPB 3.375 g  Status:  Discontinued     3.375 g 12.5 mL/hr over 240 Minutes Intravenous Every 8 hours 03/23/16 1439 03/24/16 1716   03/23/16 0800  vancomycin (VANCOCIN) 1,500 mg in sodium chloride 0.9 % 500 mL IVPB     1,500 mg 250 mL/hr over 120 Minutes Intravenous  Once 03/23/16 0746 03/23/16 1004       Assessment/Plan S/p I&D  Gluteal abscess 03/17/16 in [place] -  CT scan no abscess/fluid found, site improving Allergic drug reaction - will add Eucerin lotion for itching FEN:  Regular diet/IV fluids NWG:NFAOZHYVT:Lovenox (patient declined), ambulation ID: day 4 vancomycin and 2 days of Zosyn (now stopped)- [facility] dated 03/17/2016 revealed MRSA and organism susceptible to Cipro, Gent, Linezolid,  Levaquin, Rifampin, Bactrim, and Vancomycin Plan: Continue current antibiotic, local wound care, and heat to the site.   LOS: 2 days    Edson SnowballBROOKE A MILLER , [facility] 03/26/2016, 1:25 PM Pager: [phone] Consults: [phone] Mon-Fri 7:00 am-4:30 pm Sat-Sun 7:00 am-11:30 am

## 2016-03-26 NOTE — Progress Notes (Signed)
Patient c/o stiffness of bilateral knees when idle but condition noted better with ambulation. Patient refused toradol at this time. MD paged and aware.   Sim BoastHavy, RN

## 2016-03-26 NOTE — Progress Notes (Signed)
PROGRESS NOTE    Bethel BornRobert Allen Krueger  ZOX:[id number] DOB: 07/16/1966 DOA: 03/23/2016 PCP: No PCP Per Patient   Brief Narrative:  Mr Richard Krueger is a 50 year old gentleman with a past medical history of remote orthopedic surgery with postoperative staph infection, admitted to medicine service on 03/23/2016 when he presented with complaints of generalized weakness, malaise, fevers. He was recently found to have abscess of left thigh while he was in [place] on 03/17/2016, seen in the emergency department there where he underwent incision and drainage. He was discharged on Bactrim subsequently developed reaction. He stopped taking Bactrim.   Assessment & Plan:   Principal Problem:   Allergic reaction caused by a drug Active Problems:   Leukocytosis   Thigh abscess   Dehydration, moderate   Tobacco abuse   ?? Enteritis   Abscess, gluteal, left  1.  Left buttock abscess -He recently underwent incision and drainage on 03/17/2016 at an emergency department in [place]. He had been discharged on Bactrim however stopped this medication early due to drug reaction. -Initial labs revealing a white count of 15,400, trending down to 10,300 on a.m. lab work. -Blood cultures obtained on admission showing no growth x2  -Gen. surgery was consulted, did not recommend incision and drainage -We [facility] dated 03/17/2016 revealed MRSA, organism susceptible to Cipro, Gent, Linezolid, Levaquin, Rifampin, Bactrim, and Vancomycin.  -Will continue Vancomycin IV.  -Surgery following -Discussed with Pharmacy, we reviewed susceptibilities, will likely need Linezolid upon transitioning to oral antibiotic therapy.   2.  Drug reaction/Sulfa Allergy -He was recently seen at an emergency department in [place], prescribed Bactrim for treatment of an abscess.  -After starting Bactrim. Reported developing malaise, myalgias, with development of rash. -Labs showing CK of 149 -On physical  examination has resolving rash. -Continue supportive care   DVT prophylaxis: Lovenox Code Status: Full code Family Communication:  Disposition Plan: Continue IV antibiotic therapy  Consultants:   General surgery  Antimicrobials:   Vancomycin  Subjective: Overall reports feeling better, ambulating in the hallway.   Objective: Vitals:   03/25/16 2341 03/26/16 0542 03/26/16 1120 03/26/16 1122  BP: 129/76 125/67 (!) 161/93 139/74  Pulse: 66 (!) 59 72 70  Resp: 17 18 14    Temp: 98.7 F (37.1 C) 97.7 F (36.5 C) 98.2 F (36.8 C)   TempSrc:   Oral   SpO2: 92% 98% 99%   Weight:      Height:        Intake/Output Summary (Last 24 hours) at 03/26/16 1412 Last data filed at 03/26/16 0830  Gross per 24 hour  Intake          5056.25 ml  Output                0 ml  Net          5056.25 ml   Filed Weights   03/23/16 0149  Weight: 86.2 kg (190 lb)    Examination:  General exam: Awake and alert, nontoxic-appearing Respiratory system: Clear to auscultation. Respiratory effort normal. Cardiovascular system: S1 & S2 heard, RRR. No JVD, murmurs, rubs, gallops or clicks. No pedal edema. Gastrointestinal system: Abdomen is nondistended, soft and nontender. No organomegaly or masses felt. Normal bowel sounds heard. Central nervous system: Alert and oriented. No focal neurological deficits. Extremities: Symmetric 5 x 5 power. Skin: Interim abscess over the left buttock region having surrounding induration and erythema, there is also a quarter-sized lesion over right medial thigh indurated with associated  erythema, without evidence of urinary drainage. There is also bilateral lower extremity maculopapular rash Psychiatry: Judgement and insight appear normal. Mood & affect appropriate.     Data Reviewed: I have personally reviewed following labs and imaging studies  CBC:  Recent Labs Lab 03/23/16 0159 03/24/16 0543 03/25/16 0515  WBC 15.4* 10.3 10.3  NEUTROABS 8.7*  --    --   HGB 17.0 13.6 14.2  HCT 47.7 40.2 42.6  MCV 100.2* 101.0* 101.2*  PLT 241 176 199   Basic Metabolic Panel:  Recent Labs Lab 03/23/16 0159 03/24/16 0543 03/25/16 0515  NA 134* 137 141  K 4.1 4.1 3.9  CL 101 108 106  CO2 25 24 25   GLUCOSE 108* 94 90  BUN 21* 14 14  CREATININE 0.94 0.77 0.80  CALCIUM 9.6 8.6* 8.8*   GFR: Estimated Creatinine Clearance: 121.5 mL/min (by C-G formula based on SCr of 0.8 mg/dL). Liver Function Tests:  Recent Labs Lab 03/23/16 0159 03/24/16 0543  AST 32 26  ALT 30 24  ALKPHOS 84 60  BILITOT 0.9 0.9  PROT 8.8* 6.3*  ALBUMIN 4.0 2.9*   No results for input(s): LIPASE, AMYLASE in the last 168 hours. No results for input(s): AMMONIA in the last 168 hours. Coagulation Profile: No results for input(s): INR, PROTIME in the last 168 hours. Cardiac Enzymes:  Recent Labs Lab 03/23/16 0159  CKTOTAL 149  CKMB 3.4   BNP (last 3 results) No results for input(s): PROBNP in the last 8760 hours. HbA1C: No results for input(s): HGBA1C in the last 72 hours. CBG: No results for input(s): GLUCAP in the last 168 hours. Lipid Profile: No results for input(s): CHOL, HDL, LDLCALC, TRIG, CHOLHDL, LDLDIRECT in the last 72 hours. Thyroid Function Tests: No results for input(s): TSH, T4TOTAL, FREET4, T3FREE, THYROIDAB in the last 72 hours. Anemia Panel: No results for input(s): VITAMINB12, FOLATE, FERRITIN, TIBC, IRON, RETICCTPCT in the last 72 hours. Sepsis Labs: No results for input(s): PROCALCITON, LATICACIDVEN in the last 168 hours.  Recent Results (from the past 240 hour(s))  Culture, blood (Routine X 2) w Reflex to ID Panel     Status: None (Preliminary result)   Collection Time: 03/23/16  7:55 AM  Result Value Ref Range Status   Specimen Description BLOOD LEFT FOREARM  Final   Special Requests BOTTLES DRAWN AEROBIC AND ANAEROBIC 5CC  Final   Culture NO GROWTH 2 DAYS  Final   Report Status PENDING  Incomplete  Culture, blood (Routine X  2) w Reflex to ID Panel     Status: None (Preliminary result)   Collection Time: 03/23/16  8:00 AM  Result Value Ref Range Status   Specimen Description BLOOD RIGHT ANTECUBITAL  Final   Special Requests BOTTLES DRAWN AEROBIC AND ANAEROBIC 10CC  Final   Culture NO GROWTH 2 DAYS  Final   Report Status PENDING  Incomplete         Radiology Studies: No results found.    Scheduled Meds: . enoxaparin (LOVENOX) injection  40 mg Subcutaneous Q24H  . hydrocerin   Topical BID  . ketorolac  15 mg Intravenous Q6H  . nicotine  21 mg Transdermal Daily  . vancomycin  1,250 mg Intravenous Q8H   Continuous Infusions: . sodium chloride 75 mL/hr at 03/25/16 1958     LOS: 2 days    Time spent: 15 min    Jeralyn BennettZAMORA, Shadd Dunstan, MD [facility] Pager [phone]  If 7PM-7AM, please contact night-coverage [email or web address] Password [email or web address] 03/26/2016, 2:12 PM

## 2016-03-27 MED ORDER — ACETAMINOPHEN 325 MG PO TABS: 650.0000 mg | ORAL_TABLET | Freq: Four times a day (QID) | ORAL | 0 refills | Status: DC | PRN

## 2016-03-27 MED ORDER — LINEZOLID 600 MG PO TABS: 600.0000 mg | ORAL_TABLET | Freq: Two times a day (BID) | ORAL | 0 refills | Status: DC

## 2016-03-27 MED FILL — *ZYVOX 600 MG TABLET: 600 | 9 days supply | Qty: 18 | Fill #0

## 2016-03-27 NOTE — Progress Notes (Signed)
  Subjective: Ambulate in hall.  Feeling better. Pain in left thigh wound less. Small right thigh wound asymptomatic Rash no worse and no better.  Some itching but no secondary infection.  Presumably due to Bactrim and sulfa allergy.  Objective: Vital signs in last 24 hours: Temp:  [98 F (36.7 C)-98.2 F (36.8 C)] 98.2 F (36.8 C) (08/22 0555) Pulse Rate:  [58-72] 58 (08/22 0555) Resp:  [14-18] 16 (08/22 0555) BP: (128-161)/(72-93) 128/72 (08/22 0555) SpO2:  [98 %-99 %] 98 % (08/22 0555) Last BM Date: 03/26/16  Intake/Output from previous day: 08/21 0701 - 08/22 0700 In: 3510 [P.O.:960; I.V.:1850; IV Piggyback:700] Out: -  Intake/Output this shift: No intake/output data recorded.  General appearance: Alert.  Talkative.  No distress.  In relating in hall. Resp: clear to auscultation bilaterally Incision/Wound: Right 5 wound tiny, dry eschar.  No drainage.  No erythema.  Left proximal posterior abscess with decreased tenderness drainage and mass.  No undrained pus.  Lab Results:   Recent Labs  03/25/16 0515  WBC 10.3  HGB 14.2  HCT 42.6  PLT 199   BMET  Recent Labs  03/25/16 0515  NA 141  K 3.9  CL 106  CO2 25  GLUCOSE 90  BUN 14  CREATININE 0.80  CALCIUM 8.8*   PT/INR No results for input(s): LABPROT, INR in the last 72 hours. ABG No results for input(s): PHART, HCO3 in the last 72 hours.  Invalid input(s): PCO2, PO2  Studies/Results: No results found.  Anti-infectives: Anti-infectives    Start     Dose/Rate Route Frequency Ordered Stop   03/26/16 1400  vancomycin (VANCOCIN) 1,250 mg in sodium chloride 0.9 % 250 mL IVPB     1,250 mg 166.7 mL/hr over 90 Minutes Intravenous Every 8 hours 03/26/16 1114     03/23/16 2000  vancomycin (VANCOCIN) IVPB 1000 mg/200 mL premix  Status:  Discontinued     1,000 mg 200 mL/hr over 60 Minutes Intravenous Every 12 hours 03/23/16 0747 03/26/16 1114   03/23/16 1500  piperacillin-tazobactam (ZOSYN) IVPB 3.375 g   Status:  Discontinued     3.375 g 12.5 mL/hr over 240 Minutes Intravenous Every 8 hours 03/23/16 1439 03/24/16 1716   03/23/16 0800  vancomycin (VANCOCIN) 1,500 mg in sodium chloride 0.9 % 500 mL IVPB     1,500 mg 250 mL/hr over 120 Minutes Intravenous  Once 03/23/16 0746 03/23/16 1004      Assessment/Plan:  S/p I&D Gluteal abscess 03/17/16 in [place] - CT scan no abscess/fluid found, site improving  - From a general surgical standpoint, he may be discharged.  - Advised another 7-10 days of antibiotics that covers MRSA  -Shower daily with Dial soap.  -Change dressing 2 or 3 times a day depending on drainage  - Follow-up in [facility] clinic in 2 weeks  -No intervention required for right thigh wound.  Should heal on its own. We will sign off.  Allergic drug reaction - will add Eucerin lotion for itching FEN: Regular diet/IV fluids ZOX:WRUEAVWVT:Lovenox (patient declined), ambulation ID: day 5 vancomycin and 2 days of Zosyn (now stopped)- [facility] dated 03/17/2016 revealed MRSA and organism susceptible to Cipro, Gent, Linezolid, Levaquin, Rifampin, Bactrim, and Vancomycin Plan: Continue current antibiotic, local wound care,   LOS: 3 days    Richard KelpINGRAM,Eloyse Causey M 03/27/2016

## 2016-03-27 NOTE — Care Management Note (Signed)
Case Management Note  Patient Details  Name: Richard BornRobert Allen Dormer MRN: [id number] Date of Birth: 08/27/1965  Subjective/Objective:                 [facility]. [facility]. PDMI system down, spoke with Nadara MustardNita Johnston and Ellin MayhewAndy Starkey [facility]ist [facility], [facility] both [facility]. [name] [facility] tomorrow.    Action/Plan:   Expected Discharge Date:                  Expected Discharge Plan:  Home/Self Care  In-House Referral:  NA  Discharge planning Services  CM Consult, MATCH Program  Post Acute Care Choice:  NA Choice offered to:  NA  DME Arranged:  N/A DME Agency:  NA  HH Arranged:  NA HH Agency:  NA  Status of Service:  Completed, signed off  If discussed at Long Length of Stay Meetings, dates discussed:    Additional Comments:  Lawerance SabalDebbie Sicily Zaragoza, RN 03/27/2016, 12:46 PM

## 2016-03-27 NOTE — Progress Notes (Signed)
Pt given discharge instructions, prescriptions, and care notes. Pt verbalized understanding AEB no further questions or concerns at this time. IV was discontinued, no redness, pain, or swelling noted at this time. Pt left the floor in stable condition. 

## 2016-03-27 NOTE — Discharge Summary (Signed)
Physician Discharge Summary  Richard Krueger ZOX:[id number] DOB: 12-29-1965 DOA: 03/23/2016  PCP: No PCP Per Patient  Admit date: 03/23/2016 Discharge date: 03/27/2016  Time spent: 35  minutes  Recommendations for Outpatient Follow-up:  1. Please follow up on with buttock abscess, previous cultures from hospital in [place] growing MRSA, was treated with IV vancomycin during this hospitalization discharged on linezolid for a total of 2 weeks of antibiotic therapy   Discharge Diagnoses:  Principal Problem:   Allergic reaction caused by a drug Active Problems:   Leukocytosis   Thigh abscess   Dehydration, moderate   Tobacco abuse   ?? Enteritis   Abscess, gluteal, left   Discharge Condition: Able  Diet recommendation: Regular  Filed Weights   03/23/16 0149  Weight: 86.2 kg (190 lb)    History of present illness:  Richard Krueger is a 50 y.o. male with medical history significant for ongoing tobacco abuse, remote orthopedic surgery with postoperative staph infection (patient thinks was MRSA) who was recently treated for an uncomplicated superficial abscess on the right medial thigh while working in [place] this past week. Patient noted that the abscess initially began as a tiny maculopapular area and increased in size this was associated with low-grade fevers. He went to the ER over the weekend where an local I&D was performed and cultures were obtained. Patient was empirically started on Bactrim. By Monday evening into Tuesday morning he had noticed a diffuse maculopapular rash without blisters primarily in the legs but someone in the upper extremities. There was no pruritis or urticaria/"hives". Patient stopped the Bactrim since he presumed this was the cause. He took a total of 9 pills over 4 days as instructed. He is not taking any other medications. He has not started any new medications, skin lotions, no changes in soap or washing powder. He has persisted with a low-grade  fever. Since that time he has also had some generalized malaise with upset stomach without nausea vomiting or diarrhea, anorexia and poor oral intake. Has 24 hours of malaise has progressed to significant fatigue and reports of muscle tightness. No involvement of the jaw. Patient denies knowledge of tick bite.  Hospital Course:  [name] is a 50 year old gentleman with a past medical history of remote orthopedic surgery with postoperative staph infection, admitted to medicine service on 03/23/2016 when he presented with complaints of generalized weakness, malaise, fevers. He was recently found to have abscess of left thigh while he was in [place] on 03/17/2016, seen in the emergency department there where he underwent incision and drainage. He was discharged on Bactrim subsequently developed reaction. He stopped taking Bactrim.   1.  Left buttock abscess -He recently underwent incision and drainage on 03/17/2016 at an emergency department in [place]. He had been discharged on Bactrim however stopped this medication early due to drug reaction. -Initial labs revealing a white count of 15,400, trending down to 10,300 on a.m. lab work. -Blood cultures obtained on admission showing no growth x2  -Gen. surgery was consulted, did not recommend incision and drainage -We received wound culture from [facility] dated 03/17/2016 revealed MRSA, organism susceptible to Cipro, Gent, Linezolid, Levaquin, Rifampin, Bactrim, and Vancomycin.  -Will continue Vancomycin IV.  -Surgery following -Discussed with Pharmacy, we reviewed susceptibilities, will likely need Linezolid upon transitioning to oral antibiotic therapy.  -Patient discharged on 03/27/2016 on linezolid 600 mg by mouth twice a day. Plan for a total of 2 weeks of therapy  2.  Drug reaction/Sulfa Allergy -  He was recently seen at an [place], prescribed Bactrim for treatment of an abscess.  -After starting Bactrim. Reported  developing malaise, myalgias, with development of rash. -Labs showing CK of 149 -On physical examination has resolving rash. -Continue supportive care  Consultations:  General surgery  Discharge Exam: Vitals:   03/26/16 2108 03/27/16 0555  BP: 129/86 128/72  Pulse: 65 (!) 58  Resp: 18 16  Temp: 98.2 F (36.8 C) 98.2 F (36.8 C)    General exam: Awake and alert, nontoxic-appearing Respiratory system: Clear to auscultation. Respiratory effort normal. Cardiovascular system: S1 & S2 heard, RRR. No JVD, murmurs, rubs, gallops or clicks. No pedal edema. Gastrointestinal system: Abdomen is nondistended, soft and nontender. No organomegaly or masses felt. Normal bowel sounds heard. Central nervous system: Alert and oriented. No focal neurological deficits. Extremities: Symmetric 5 x 5 power. Skin: Interim abscess over the left buttock region having surrounding induration and erythema, there is also a quarter-sized lesion over right medial thigh indurated with associated erythema, without evidence of urinary drainage. There is also bilateral lower extremity maculopapular rash Psychiatry: Judgement and insight appear normal. Mood & affect appropriate.   Discharge Instructions   Discharge Instructions    Call MD for:    Complete by:  As directed   Call MD for:  difficulty breathing, headache or visual disturbances    Complete by:  As directed   Call MD for:  extreme fatigue    Complete by:  As directed   Call MD for:  hives    Complete by:  As directed   Call MD for:  persistant dizziness or light-headedness    Complete by:  As directed   Call MD for:  persistant nausea and vomiting    Complete by:  As directed   Call MD for:  redness, tenderness, or signs of infection (pain, swelling, redness, odor or green/yellow discharge around incision site)    Complete by:  As directed   Call MD for:  severe uncontrolled pain    Complete by:  As directed   Call MD for:  temperature >100.4     Complete by:  As directed   Diet - low sodium heart healthy    Complete by:  As directed   Increase activity slowly    Complete by:  As directed     Current Discharge Medication List    START taking these medications   Details  acetaminophen (TYLENOL) 325 MG tablet Take 2 tablets (650 mg total) by mouth every 6 (six) hours as needed for mild pain (or Fever >/= 101). Qty: 20 tablet, Refills: 0    linezolid (ZYVOX) 600 MG tablet Take 1 tablet (600 mg total) by mouth 2 (two) times daily. Qty: 18 tablet, Refills: 0      STOP taking these medications     Benzocaine (BOIL-EASE EX)        Allergies  Allergen Reactions  . Sulfa Antibiotics Other (See Comments)    Swollen muscles, stiff joints and break out   Follow-up Information    [facility] Schedule an appointment as soon as possible for a visit in 2 week(s).   Specialty:  General Surgery Contact information: [address] [place] [id number] [phone]       [facility] .   Why:  Call Aug 28 to schedule follow up apt Contact information: [address] [place] [phone]  The results of significant diagnostics from this hospitalization (including imaging, microbiology, ancillary and laboratory) are listed below for reference.    Significant Diagnostic Studies: Ct Abdomen Pelvis W Contrast  Result Date: 03/23/2016 CLINICAL DATA:  Diffuse myalgias, rash. Low-grade fevers. Abscess on thigh. EXAM: CT ABDOMEN AND PELVIS WITH CONTRAST TECHNIQUE: Multidetector CT imaging of the abdomen and pelvis was performed using the standard protocol following bolus administration of intravenous contrast. CONTRAST:  100 cc Isovue-300 IV COMPARISON:  Plain films today. FINDINGS: Lower chest: Dependent atelectasis in the lung bases. Heart is normal size. No effusions. Hepatobiliary: No focal hepatic  abnormality. Gallbladder unremarkable. Pancreas: No focal abnormality or ductal dilatation. Spleen: No focal abnormality.  Normal size. Adrenals/Urinary Tract: No adrenal abnormality. No focal renal abnormality. No stones or hydronephrosis. Urinary bladder is unremarkable. Stomach/Bowel: Appendix is visualized and is prominent, measuring 8 mm in diameter. Note surrounding inflammatory changes. Stomach, large and small bowel grossly unremarkable. Vascular/Lymphatic: Aortic and iliac calcifications. No aneurysm. Small retroperitoneal lymph nodes, the largest index node in the left periaortic region measuring up to 10 mm short axis diameter. Small and borderline sized lymph nodes continues throughout the iliac chains and inguinal regions bilaterally. Reproductive: No visible focal abnormality. Other: No free fluid or free air. No visible soft tissue abnormality or abscess. Musculoskeletal: Hardware within the proximal left femur. No acute bony abnormality or focal bone lesion. IMPRESSION: Scattered shotty retroperitoneal, pelvic and inguinal lymph nodes, presumably reactive. These could be followed clinically within the inguinal regions and in the abdomen and pelvis with repeat CT in 6-12 months. Mildly prominent appendix, 8 mm in diameter. No visible inflammatory changes surrounding the appendix. Recommend clinical correlation to exclude early acute appendicitis. Electronically Signed   By: Charlett NoseKevin  Dover M.D.   On: 03/23/2016 16:27   Dg Abdomen Acute W/chest  Result Date: 03/23/2016 CLINICAL DATA:  Allergic reaction to the antibiotic. Patient has lost muscle control of legs. Fever. EXAM: DG ABDOMEN ACUTE W/ 1V CHEST COMPARISON:  None. FINDINGS: Normal heart size and pulmonary vascularity. No focal airspace disease or consolidation in the lungs. No blunting of costophrenic angles. No pneumothorax. Mediastinal contours appear intact. Scattered gas and stool throughout the colon. Gas-filled nondilated left upper  quadrant small bowel with apparent fold thickening. This may be due to ileus or enteritis. No free intra-abdominal air. No abnormal air-fluid levels. No radiopaque stones. Visualized bones appear intact. IMPRESSION: No evidence of active pulmonary disease. Nondilated gas-filled small bowel with fold thickening may indicate enteritis or ileus. No findings to suggest obstruction. Electronically Signed   By: Burman NievesWilliam  Stevens M.D.   On: 03/23/2016 06:33    Microbiology: Recent Results (from the past 240 hour(s))  Culture, blood (Routine X 2) w Reflex to ID Panel     Status: None (Preliminary result)   Collection Time: 03/23/16  7:55 AM  Result Value Ref Range Status   Specimen Description BLOOD LEFT FOREARM  Final   Special Requests BOTTLES DRAWN AEROBIC AND ANAEROBIC 5CC  Final   Culture NO GROWTH 3 DAYS  Final   Report Status PENDING  Incomplete  Culture, blood (Routine X 2) w Reflex to ID Panel     Status: None (Preliminary result)   Collection Time: 03/23/16  8:00 AM  Result Value Ref Range Status   Specimen Description BLOOD RIGHT ANTECUBITAL  Final   Special Requests BOTTLES DRAWN AEROBIC AND ANAEROBIC 10CC  Final   Culture NO GROWTH 3 DAYS  Final   Report Status PENDING  Incomplete  Labs: Basic Metabolic Panel:  Recent Labs Lab 03/23/16 0159 03/24/16 0543 03/25/16 0515  NA 134* 137 141  K 4.1 4.1 3.9  CL 101 108 106  CO2 25 24 25   GLUCOSE 108* 94 90  BUN 21* 14 14  CREATININE 0.94 0.77 0.80  CALCIUM 9.6 8.6* 8.8*   Liver Function Tests:  Recent Labs Lab 03/23/16 0159 03/24/16 0543  AST 32 26  ALT 30 24  ALKPHOS 84 60  BILITOT 0.9 0.9  PROT 8.8* 6.3*  ALBUMIN 4.0 2.9*   No results for input(s): LIPASE, AMYLASE in the last 168 hours. No results for input(s): AMMONIA in the last 168 hours. CBC:  Recent Labs Lab 03/23/16 0159 03/24/16 0543 03/25/16 0515  WBC 15.4* 10.3 10.3  NEUTROABS 8.7*  --   --   HGB 17.0 13.6 14.2  HCT 47.7 40.2 42.6  MCV 100.2*  101.0* 101.2*  PLT 241 176 199   Cardiac Enzymes:  Recent Labs Lab 03/23/16 0159  CKTOTAL 149  CKMB 3.4   BNP: BNP (last 3 results) No results for input(s): BNP in the last 8760 hours.  ProBNP (last 3 results) No results for input(s): PROBNP in the last 8760 hours.  CBG: No results for input(s): GLUCAP in the last 168 hours.     Signed:  Jeralyn Bennett MD.  [facility] 03/27/2016, 12:54 PM

## 2016-03-28 LAB — CULTURE, BLOOD (ROUTINE X 2)
CULTURE: NO GROWTH
Culture: NO GROWTH

## 2016-04-11 ENCOUNTER — Encounter: Payer: Self-pay | Admitting: Family Medicine

## 2016-04-11 ENCOUNTER — Ambulatory Visit: Payer: Self-pay | Attending: Family Medicine | Admitting: Family Medicine

## 2016-04-11 VITALS — BP 107/74 | HR 64 | Temp 97.6°F | Ht 69.0 in | Wt 184.2 lb

## 2016-04-11 DIAGNOSIS — M25561 Pain in right knee: Secondary | ICD-10-CM

## 2016-04-11 DIAGNOSIS — Z889 Allergy status to unspecified drugs, medicaments and biological substances status: Secondary | ICD-10-CM

## 2016-04-11 DIAGNOSIS — T7840XA Allergy, unspecified, initial encounter: Secondary | ICD-10-CM

## 2016-04-11 DIAGNOSIS — L02419 Cutaneous abscess of limb, unspecified: Secondary | ICD-10-CM

## 2016-04-11 MED ORDER — ACETAMINOPHEN-CODEINE #3 300-30 MG PO TABS: 1.0000 | ORAL_TABLET | Freq: Two times a day (BID) | ORAL | 0 refills | Status: DC | PRN

## 2016-04-11 MED ORDER — PREDNISONE 20 MG PO TABS: 20.0000 mg | ORAL_TABLET | Freq: Every day | ORAL | 0 refills | Status: DC

## 2016-04-11 NOTE — Progress Notes (Signed)
Swollen right knee- joint and muscle pain

## 2016-04-11 NOTE — Patient Instructions (Signed)
Place knee pain patient instructions here.  

## 2016-04-12 NOTE — Progress Notes (Signed)
Subjective:  Patient ID: Richard Krueger, male    DOB: Oct 31, 1965  Age: 50 y.o. MRN: [id number]  CC: Hospitalization Follow-up (infection- MRSA)   HPI Richard Krueger is a 50 year old male who comes in for hospital follow-up after management for allergic reaction to Bactrim which he received as treatment for a buttock abscess in [place]. He had developed maculopapular rash and so stopped Bactrim.  He was seen by general surgery who did not recommend incision and drainage as he had already undergone incision and drainage at [place], one culture positive for MRSA. He was placed on IV vancomycin which was subsequently transitioned to linezolid at discharge.  Today he had a follow-up visit with Gen. surgery reports scab formation over his abscess and she no longer needs to apply dressings however he complains of right knee pain which started at that time he had the allergic reaction to Bactrim and complaints of a sensation of tightness and stiffness in his right knee with associated swelling. Pain is a 2/10 but occasionally goes up to a 10/10  History reviewed. No pertinent past medical history.  Past Surgical History:  Procedure Laterality Date  . ankle repair    . ORIF DISTAL FEMUR FRACTURE       Outpatient Medications Prior to Visit  Medication Sig Dispense Refill  . acetaminophen (TYLENOL) 325 MG tablet Take 2 tablets (650 mg total) by mouth every 6 (six) hours as needed for mild pain (or Fever >/= 101). (Patient not taking: Reported on 04/11/2016) 20 tablet 0  . linezolid (ZYVOX) 600 MG tablet Take 1 tablet (600 mg total) by mouth 2 (two) times daily. (Patient not taking: Reported on 04/11/2016) 18 tablet 0   No facility-administered medications prior to visit.     ROS Review of Systems  Constitutional: Negative for activity change and appetite change.  HENT: Negative for sinus pressure and sore throat.   Eyes: Negative for visual disturbance.  Respiratory: Negative for  cough, chest tightness and shortness of breath.   Cardiovascular: Negative for chest pain and leg swelling.  Gastrointestinal: Negative for abdominal distention, abdominal pain, constipation and diarrhea.  Endocrine: Negative.   Genitourinary: Negative for dysuria.  Musculoskeletal: Negative for joint swelling and myalgias.       See hpi  Skin: Positive for rash.  Allergic/Immunologic: Negative.   Neurological: Negative for weakness, light-headedness and numbness.  Psychiatric/Behavioral: Negative for dysphoric mood and suicidal ideas.    Objective:  BP 107/74 (BP Location: Right Arm, Patient Position: Sitting, Cuff Size: Large)   Pulse 64   Temp 97.6 F (36.4 C) (Oral)   Ht 5\' 9"  (1.753 m)   Wt 184 lb 3.2 oz (83.6 kg)   SpO2 99%   BMI 27.20 kg/m   BP/Weight 04/11/2016 03/27/2016 03/23/2016  Systolic BP 107 136 -  Diastolic BP 74 93 -  Wt. (Lbs) 184.2 - 190  BMI 27.2 - 28.06      Physical Exam  Constitutional: He is oriented to person, place, and time. He appears well-developed and well-nourished.  Cardiovascular: Normal rate, normal heart sounds and intact distal pulses.   No murmur heard. Pulmonary/Chest: Effort normal and breath sounds normal. He has no wheezes. He has no rales. He exhibits no tenderness.  Abdominal: Soft. Bowel sounds are normal. He exhibits no distension and no mass. There is no tenderness.  Musculoskeletal: Normal range of motion. He exhibits edema (mild edema right knee).  Crepitus on range of motion of both knees  Neurological: He is  alert and oriented to person, place, and time.  Skin: Rash (Pinpoint erythematous papules on legs) noted.     Assessment & Plan:   1. Allergic reaction caused by a drug Resolving  2. Knee pain, acute, right It could also be a component of underlying early osteoarthritis - acetaminophen-codeine (TYLENOL #3) 300-30 MG tablet; Take 1 tablet by mouth every 12 (twelve) hours as needed for moderate pain.  Dispense: 30  tablet; Refill: 0 - predniSONE (DELTASONE) 20 MG tablet; Take 1 tablet (20 mg total) by mouth daily with breakfast.  Dispense: 5 tablet; Refill: 0  3. Thigh abscess Resolving   Meds ordered this encounter  Medications  . acetaminophen-codeine (TYLENOL #3) 300-30 MG tablet    Sig: Take 1 tablet by mouth every 12 (twelve) hours as needed for moderate pain.    Dispense:  30 tablet    Refill:  0  . predniSONE (DELTASONE) 20 MG tablet    Sig: Take 1 tablet (20 mg total) by mouth daily with breakfast.    Dispense:  5 tablet    Refill:  0    Follow-up: Return in about 6 weeks (around 05/23/2016) for follow up of right knee pain.   Jaclyn ShaggyEnobong Amao MD

## 2017-08-24 IMAGING — CT CT ABD-PELV W/ CM
2 of 5 series · 15 of 46 positions shown, 17 images · IV contrast (agent unspecified)
Comparison: Plain films today.

CLINICAL DATA: Diffuse myalgias, rash. Low-grade fevers. Abscess on
thigh.

EXAM:
CT ABDOMEN AND PELVIS WITH CONTRAST
TECHNIQUE: Multidetector CT imaging of the abdomen and pelvis was performed
using the standard protocol following bolus administration of
intravenous contrast.
CONTRAST:  100 cc 5sovue-QEE IV

[Series 2: abd/ pelvis 5.0 i30f 1 · axial · 0.82mm/px · z∈[+630,+1125]mm · 12 of 111 slices shown, 14 images]
[im 6/111  soft-tissue]
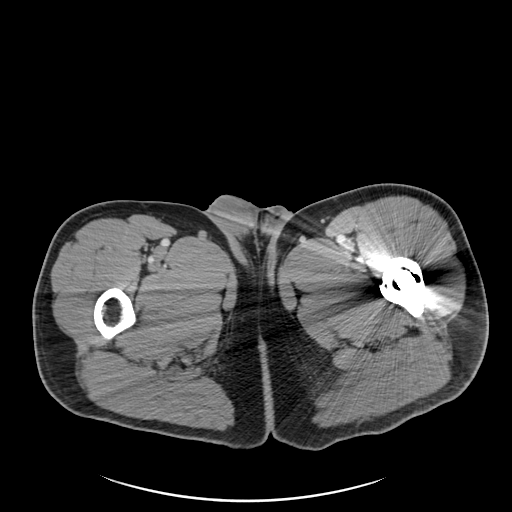
[im 6/111  bone]
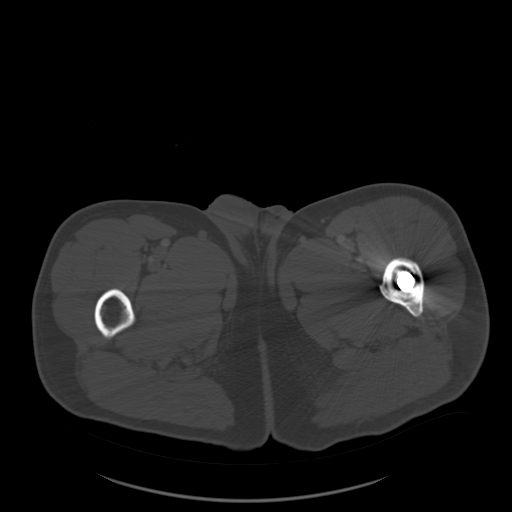
[im 18/111  soft-tissue]
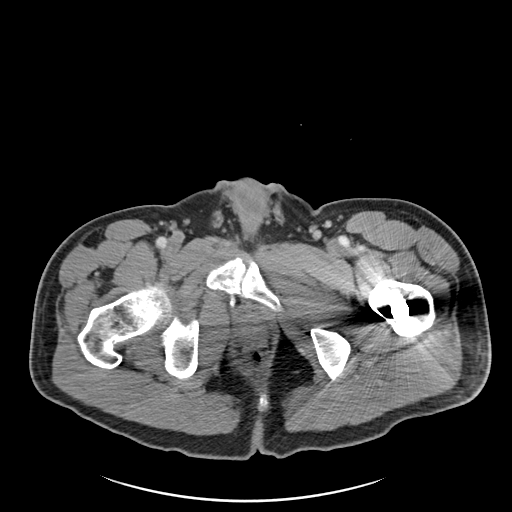
[im 24/111  soft-tissue]
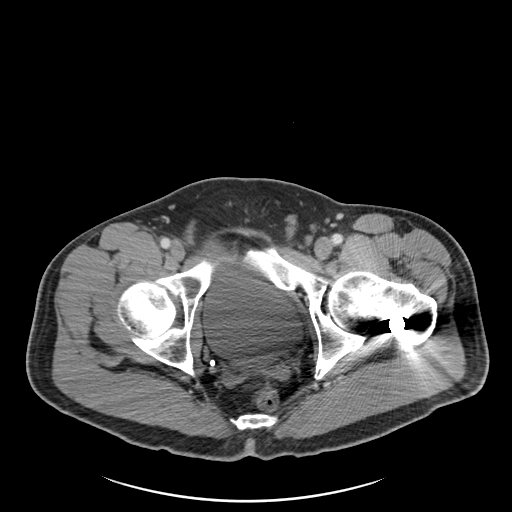
[im 35/111  soft-tissue]
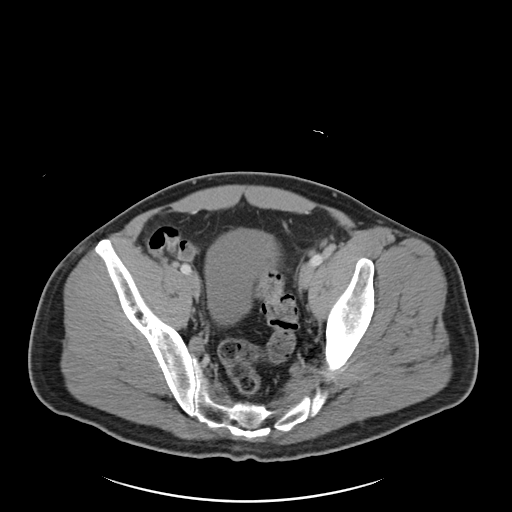
[im 41/111  soft-tissue]
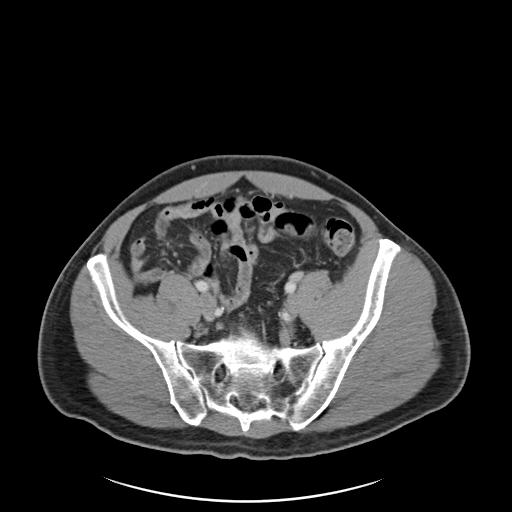
[im 53/111  soft-tissue]
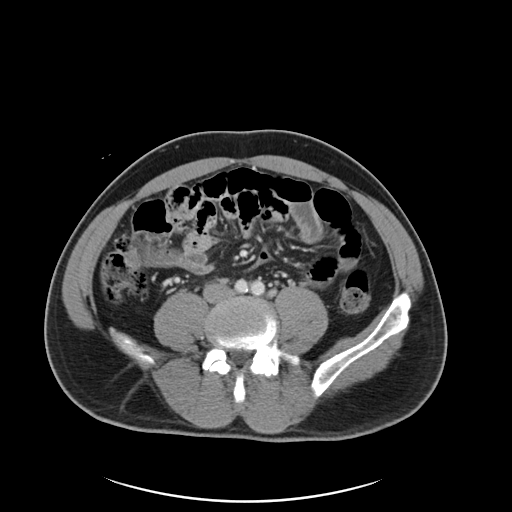
[im 58/111  soft-tissue]
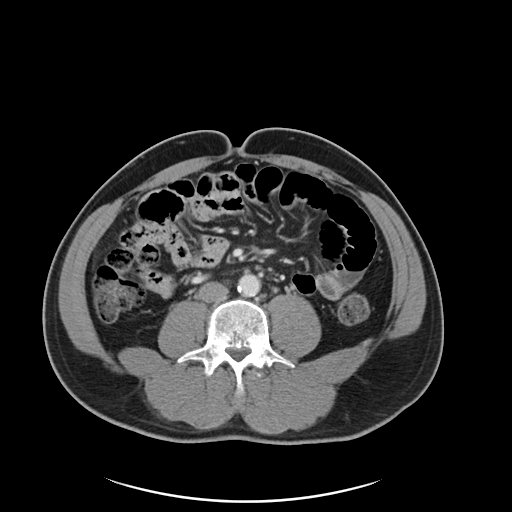
[im 70/111  soft-tissue]
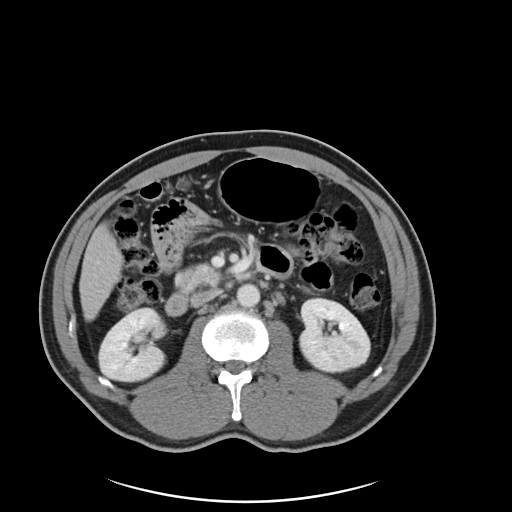
[im 76/111  soft-tissue]
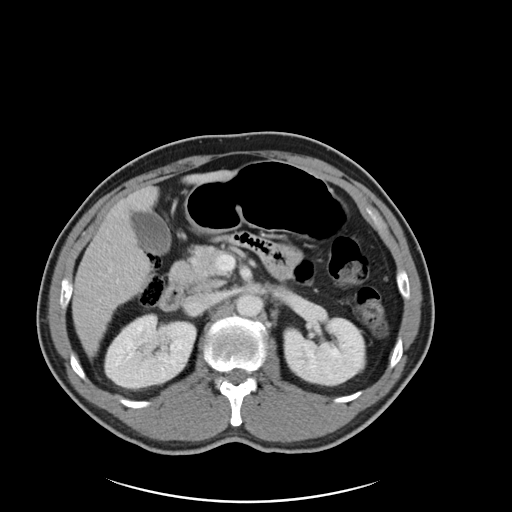
[im 76/111  bone]
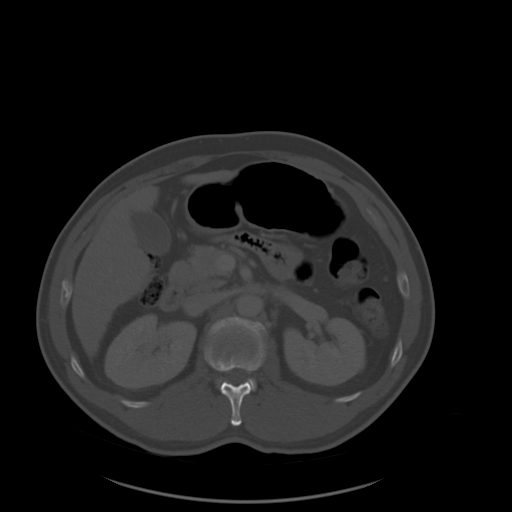
[im 87/111  soft-tissue]
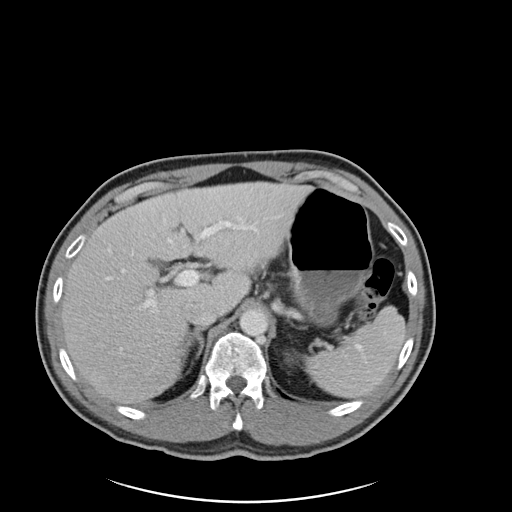
[im 93/111  soft-tissue]
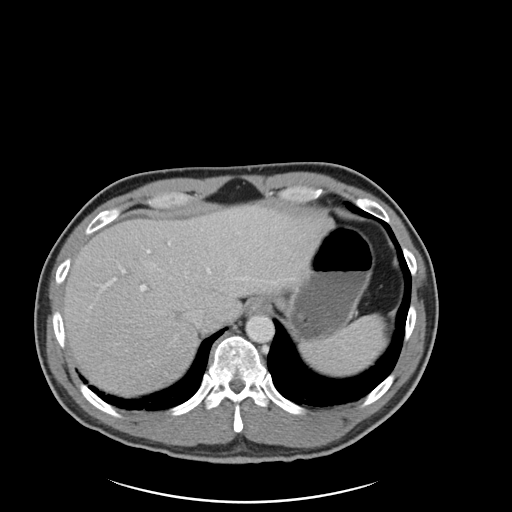
[im 105/111  soft-tissue]
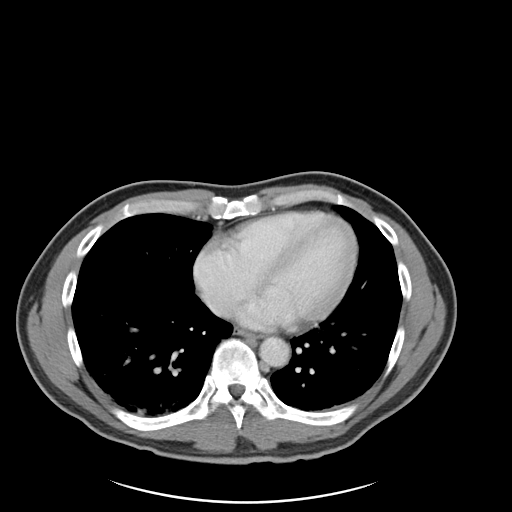

[Series 5: coronal soft tissue · coronal · 0.72mm/px · 3 of 101 slices shown]
[im 45/101  soft-tissue]
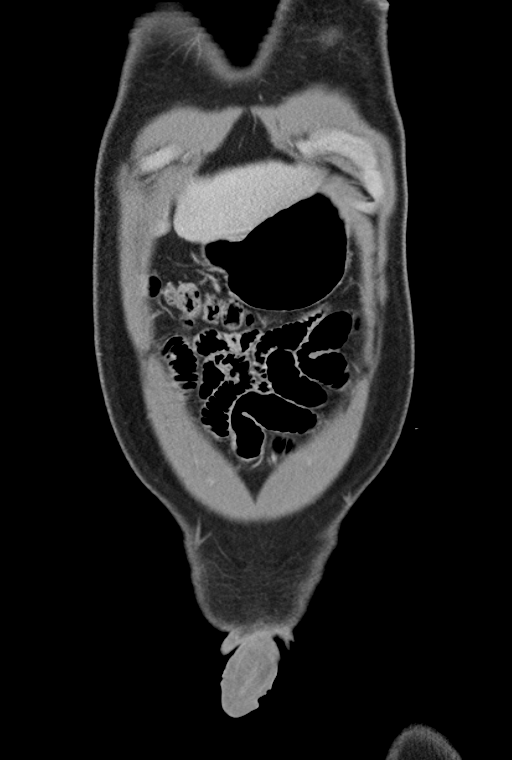
[im 56/101  soft-tissue]
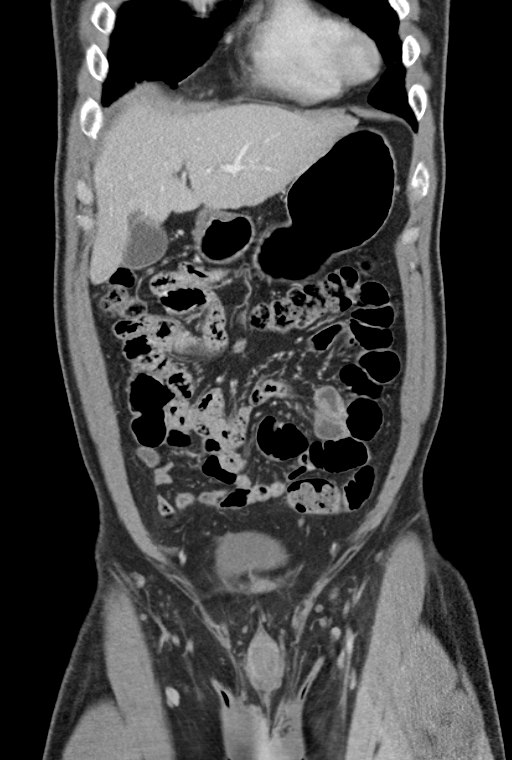
[im 67/101  soft-tissue]
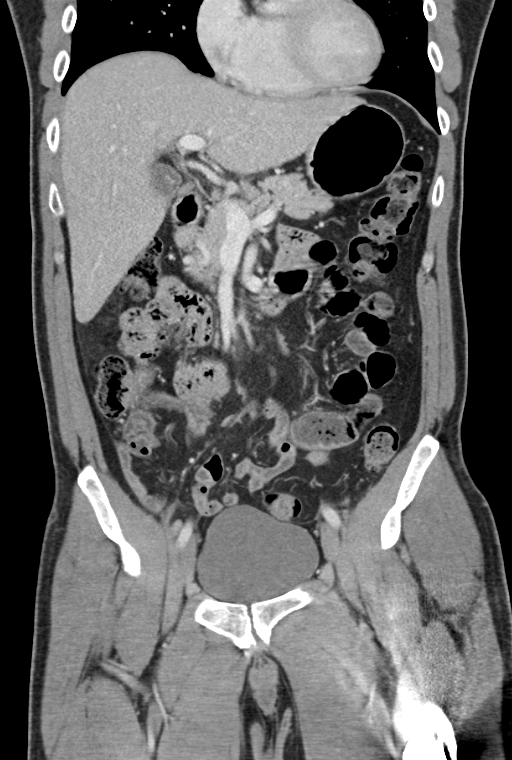

[15 of 46 positions shown; findings below may reference images not displayed]

FINDINGS: Lower chest: Dependent atelectasis in the lung bases. Heart is
normal size. No effusions.

Hepatobiliary: No focal hepatic abnormality. Gallbladder
unremarkable.

Pancreas: No focal abnormality or ductal dilatation.

Spleen: No focal abnormality.  Normal size.

Adrenals/Urinary Tract: No adrenal abnormality. No focal renal
abnormality. No stones or hydronephrosis. Urinary bladder is
unremarkable.

Stomach/Bowel: Appendix is visualized and is prominent, measuring 8
mm in diameter. Note surrounding inflammatory changes. Stomach,
large and small bowel grossly unremarkable.

Vascular/Lymphatic: Aortic and iliac calcifications. No aneurysm.
Small retroperitoneal lymph nodes, the largest index node in the
left periaortic region measuring up to 10 mm short axis diameter.
Small and borderline sized lymph nodes continues throughout the
iliac chains and inguinal regions bilaterally.

Reproductive: No visible focal abnormality.

Other: No free fluid or free air. No visible soft tissue abnormality
or abscess.

Musculoskeletal: Hardware within the proximal left femur. No acute
bony abnormality or focal bone lesion.
IMPRESSION: Scattered shotty retroperitoneal, pelvic and inguinal lymph nodes,
presumably reactive. These could be followed clinically within the
inguinal regions and in the abdomen and pelvis with repeat CT in
6-12 months.

Mildly prominent appendix, 8 mm in diameter. No visible inflammatory
changes surrounding the appendix. Recommend clinical correlation to
exclude early acute appendicitis.

## 2017-08-24 IMAGING — CR DG ABDOMEN ACUTE W/ 1V CHEST
3 series · 3 of 3 positions shown · non-contrast
Comparison: None.

CLINICAL DATA: Allergic reaction to the antibiotic. Patient has
lost muscle control of legs. Fever.

EXAM:
DG ABDOMEN ACUTE W/ 1V CHEST

[chest pa]
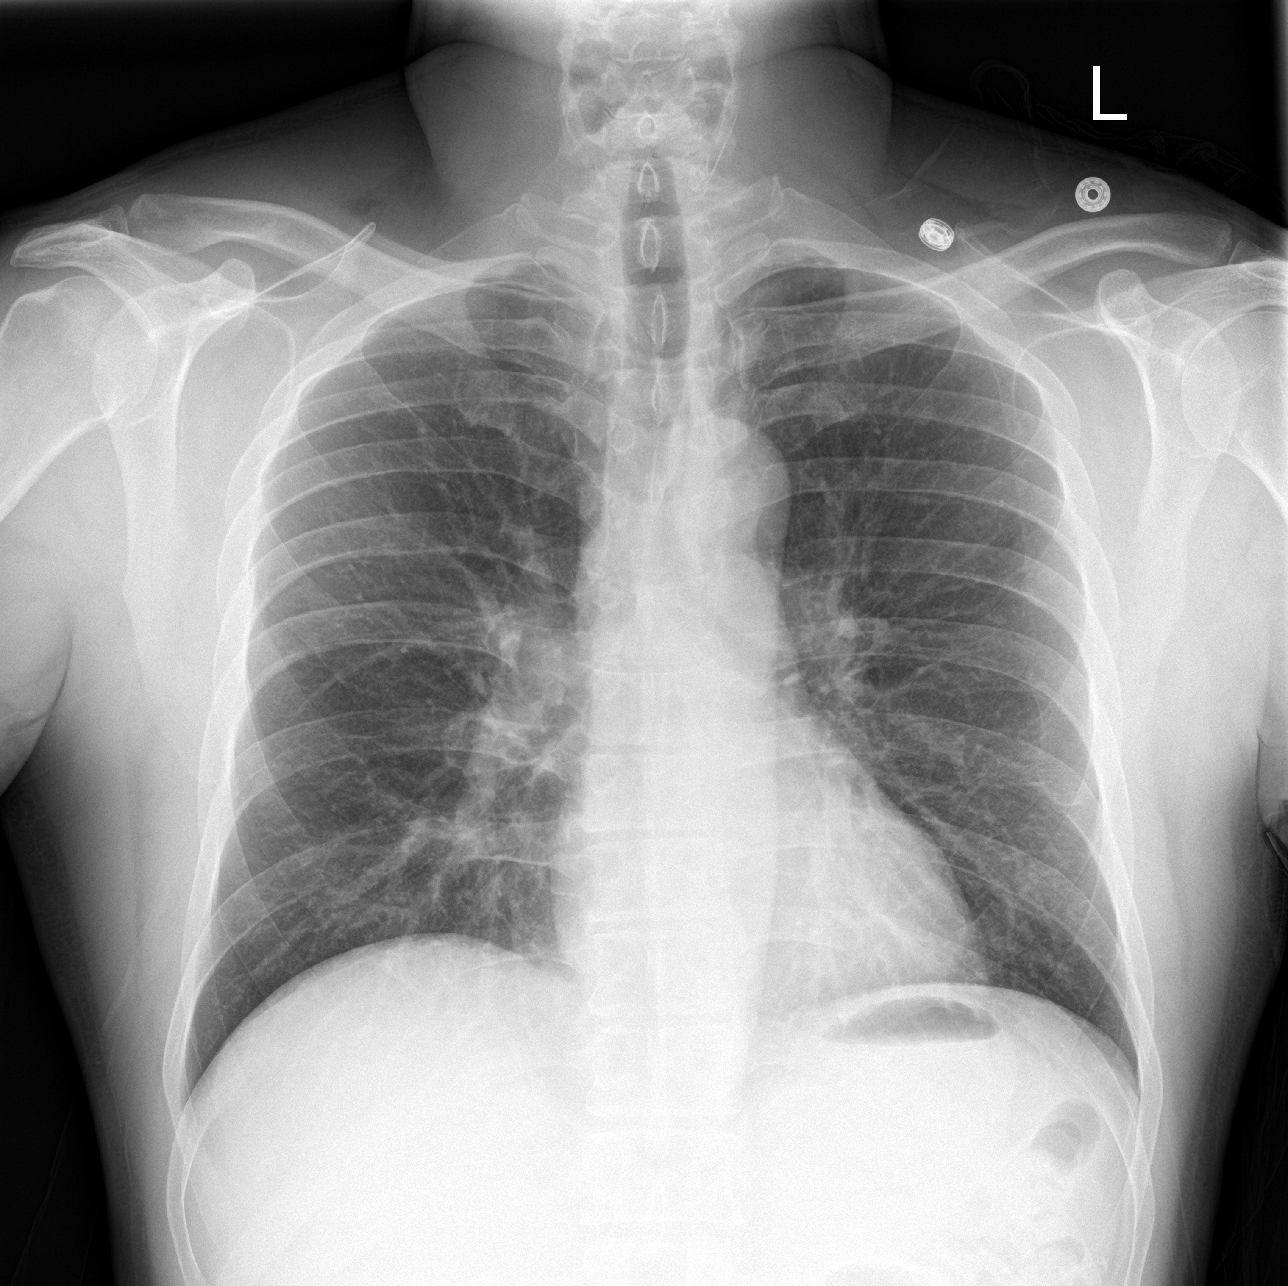

[abdomen erect]
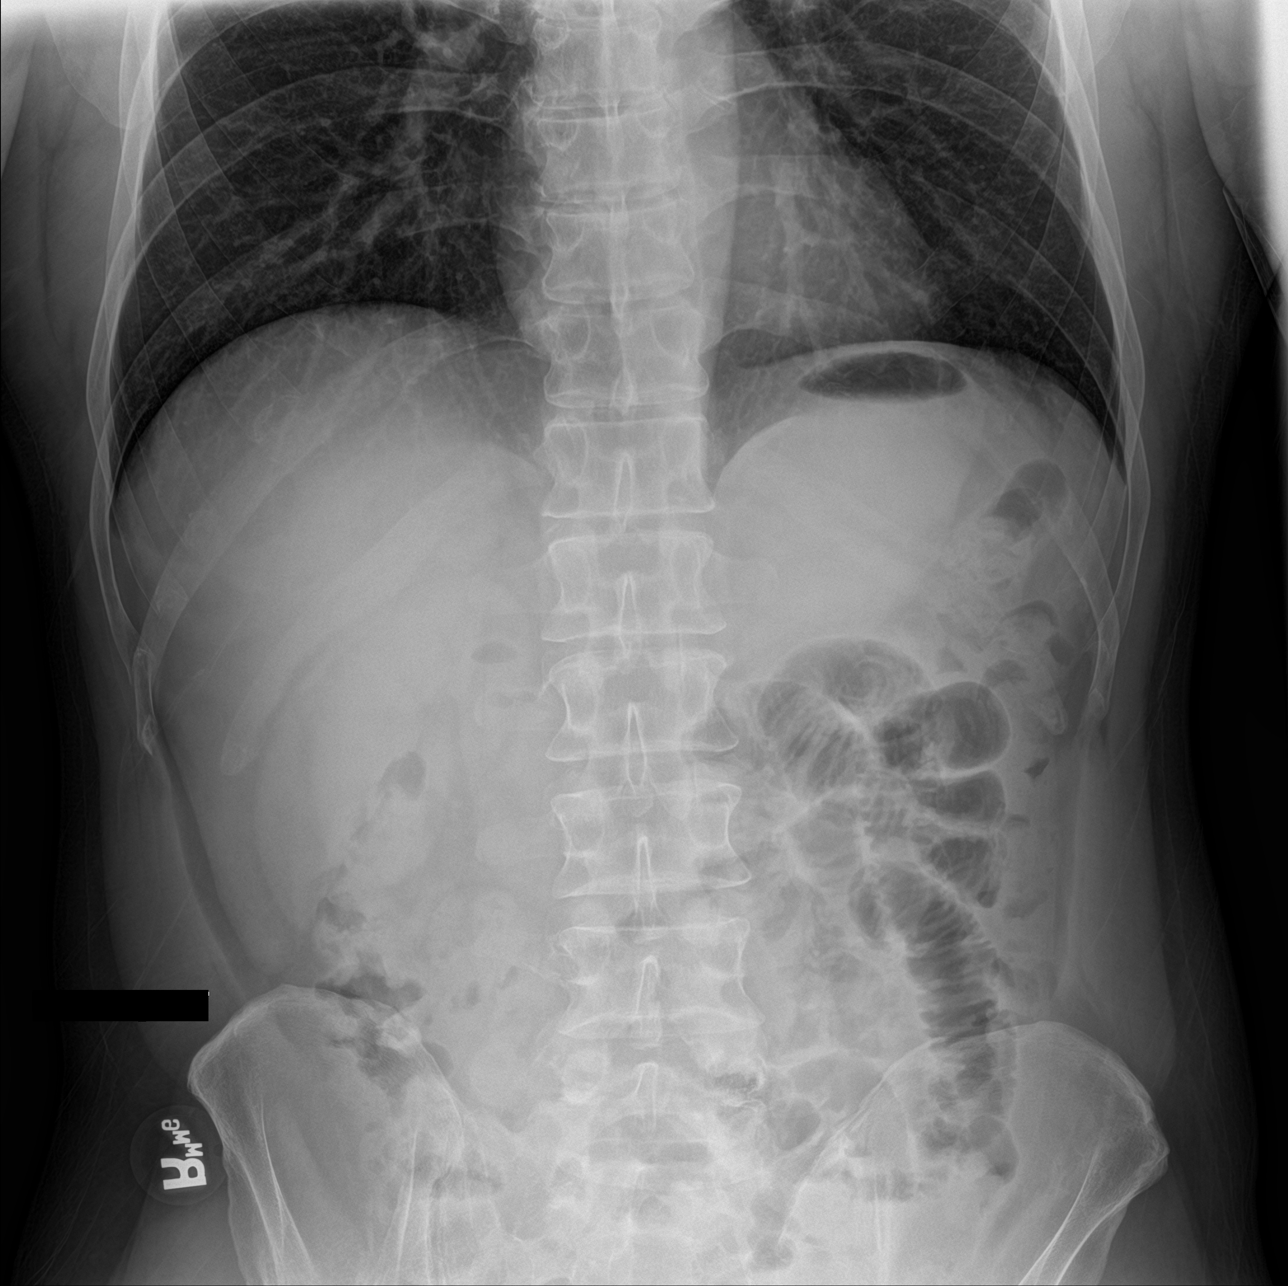

[abdomen supine]
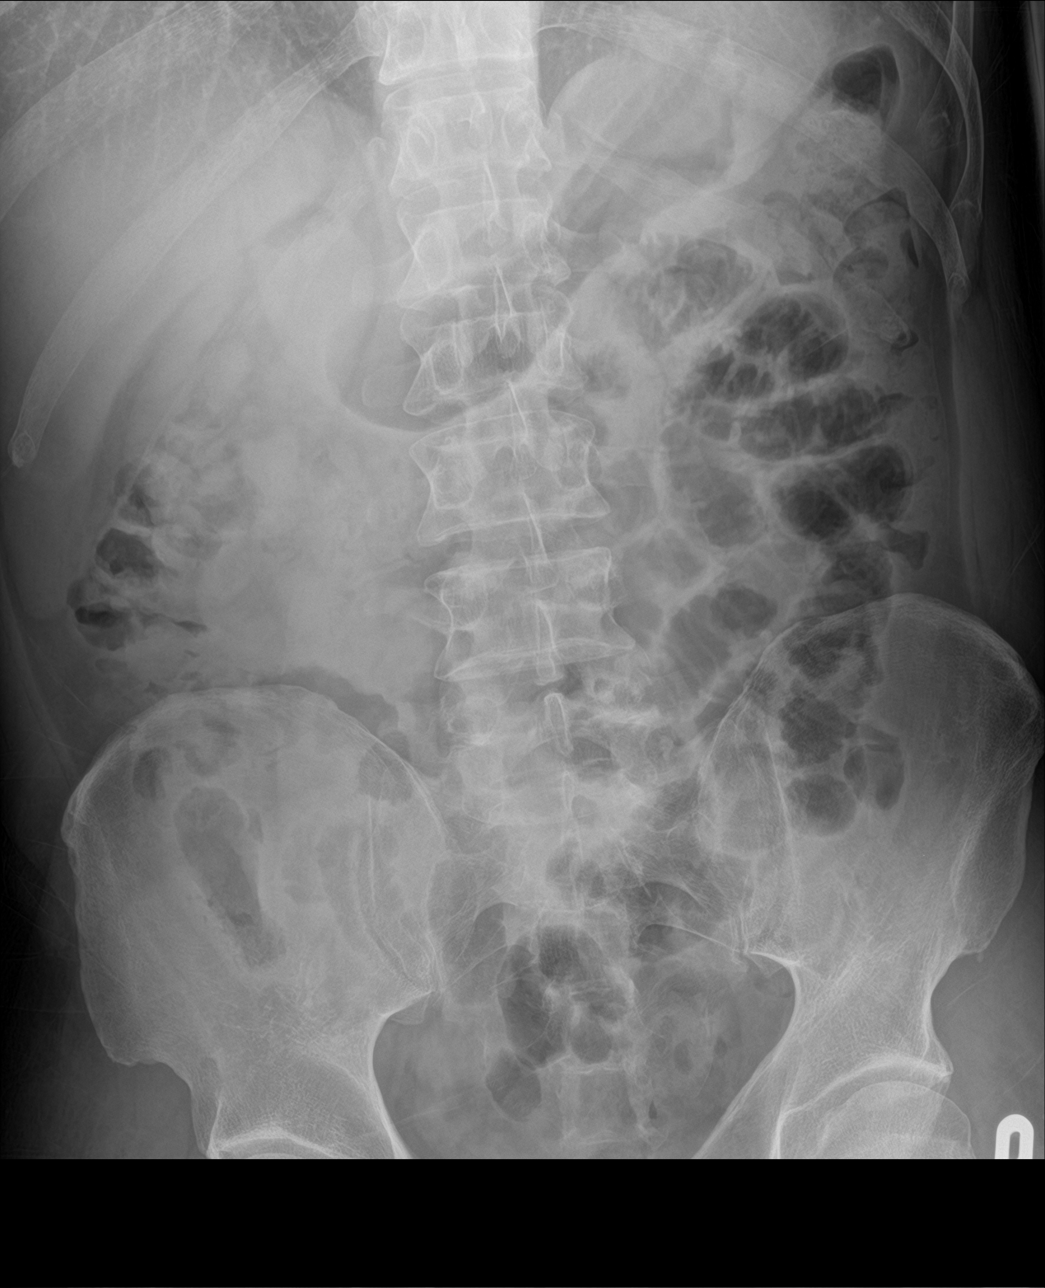

[3 of 3 positions shown; findings below may reference images not displayed]

FINDINGS: Normal heart size and pulmonary vascularity. No focal airspace
disease or consolidation in the lungs. No blunting of costophrenic
angles. No pneumothorax. Mediastinal contours appear intact.

Scattered gas and stool throughout the colon. Gas-filled nondilated
left upper quadrant small bowel with apparent fold thickening. This
may be due to ileus or enteritis. No free intra-abdominal air. No
abnormal air-fluid levels. No radiopaque stones. Visualized bones
appear intact.
IMPRESSION: No evidence of active pulmonary disease. Nondilated gas-filled small
bowel with fold thickening may indicate enteritis or ileus. No
findings to suggest obstruction.

## 2019-12-15 ENCOUNTER — Inpatient Hospital Stay (HOSPITAL_COMMUNITY)
Admission: AD | Admit: 2019-12-15 | Discharge: 2019-12-21 | DRG: 885 | Disposition: A | Discharge status: Home / Self Care | Payer: 59 | Attending: Psychiatry | Admitting: Psychiatry

## 2019-12-15 DIAGNOSIS — G4700 Insomnia, unspecified: Secondary | ICD-10-CM | POA: Diagnosis present

## 2019-12-15 DIAGNOSIS — Z882 Allergy status to sulfonamides status: Secondary | ICD-10-CM

## 2019-12-15 DIAGNOSIS — B1920 Unspecified viral hepatitis C without hepatic coma: Secondary | ICD-10-CM | POA: Diagnosis present

## 2019-12-15 DIAGNOSIS — Z9114 Patient's other noncompliance with medication regimen: Secondary | ICD-10-CM

## 2019-12-15 DIAGNOSIS — Z20822 Contact with and (suspected) exposure to covid-19: Secondary | ICD-10-CM | POA: Diagnosis present

## 2019-12-15 DIAGNOSIS — F23 Brief psychotic disorder: Principal | ICD-10-CM | POA: Diagnosis present

## 2019-12-15 DIAGNOSIS — F1011 Alcohol abuse, in remission: Secondary | ICD-10-CM | POA: Diagnosis present

## 2019-12-15 DIAGNOSIS — F419 Anxiety disorder, unspecified: Secondary | ICD-10-CM | POA: Diagnosis present

## 2019-12-15 DIAGNOSIS — Y900 Blood alcohol level of less than 20 mg/100 ml: Secondary | ICD-10-CM | POA: Diagnosis present

## 2019-12-15 DIAGNOSIS — F319 Bipolar disorder, unspecified: Secondary | ICD-10-CM | POA: Diagnosis present

## 2019-12-15 DIAGNOSIS — F17210 Nicotine dependence, cigarettes, uncomplicated: Secondary | ICD-10-CM | POA: Diagnosis present

## 2019-12-15 DIAGNOSIS — F3110 Bipolar disorder, current episode manic without psychotic features, unspecified: Secondary | ICD-10-CM | POA: Diagnosis present

## 2019-12-15 NOTE — BH Assessment (Signed)
TTS obtained collateral information from IVC petitioner, Gladis Riffle [phone]: Collateral (ex-wife, mother of his child) states patient does not have a formal diagnosis but appears manic and delusional. He is increasingly agitated and irritable. He has not made any direct threats to her or others but claims "he is God and wants to get the significant women in his life together to give them a message." She states he has not expressed SI and to her knowledge does not have a history of attempts or self harm. She states patient is using "whatever he can get" including benzo's, Adderrol, MDMA, methamphetamine, and "whip its." She states she and his friend, Arlys John, went to his home and removed his guns after the police brought him in.
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
## 2019-12-16 ENCOUNTER — Encounter (HOSPITAL_COMMUNITY): Payer: Self-pay | Admitting: Psychiatry

## 2019-12-16 ENCOUNTER — Other Ambulatory Visit: Payer: Self-pay

## 2019-12-16 DIAGNOSIS — F23 Brief psychotic disorder: Principal | ICD-10-CM

## 2019-12-16 LAB — COMPREHENSIVE METABOLIC PANEL
ALT: 187 U/L — ABNORMAL HIGH (ref 0–44)
AST: 137 U/L — ABNORMAL HIGH (ref 15–41)
Albumin: 4.3 g/dL (ref 3.5–5.0)
Alkaline Phosphatase: 86 U/L (ref 38–126)
Anion gap: 12 (ref 5–15)
BUN: 12 mg/dL (ref 6–20)
CO2: 24 mmol/L (ref 22–32)
Calcium: 9.6 mg/dL (ref 8.9–10.3)
Chloride: 102 mmol/L (ref 98–111)
Creatinine, Ser: 0.96 mg/dL (ref 0.61–1.24)
GFR calc Af Amer: >60 mL/min (ref >60)
GFR calc non Af Amer: >60 mL/min (ref >60)
Glucose, Bld: 94 mg/dL (ref 70–99)
Potassium: 4.4 mmol/L (ref 3.5–5.1)
Sodium: 138 mmol/L (ref 135–145)
Total Bilirubin: 1.3 mg/dL — ABNORMAL HIGH (ref 0.3–1.2)
Total Protein: 8.1 g/dL (ref 6.5–8.1)

## 2019-12-16 LAB — LIPID PANEL
Cholesterol: 195 mg/dL (ref 0–200)
HDL: 71 mg/dL (ref >40)
LDL Cholesterol: 115 mg/dL — ABNORMAL HIGH (ref 0–99)
Total CHOL/HDL Ratio: 2.7 RATIO
Triglycerides: 47 mg/dL (ref <150)
VLDL: 9 mg/dL (ref 0–40)

## 2019-12-16 LAB — HEPATIC FUNCTION PANEL
ALT: 186 U/L — ABNORMAL HIGH (ref 0–44)
AST: 139 U/L — ABNORMAL HIGH (ref 15–41)
Albumin: 4.3 g/dL (ref 3.5–5.0)
Alkaline Phosphatase: 89 U/L (ref 38–126)
Bilirubin, Direct: 0.3 mg/dL — ABNORMAL HIGH (ref 0.0–0.2)
Indirect Bilirubin: 0.9 mg/dL (ref 0.3–0.9)
Total Bilirubin: 1.2 mg/dL (ref 0.3–1.2)
Total Protein: 7.8 g/dL (ref 6.5–8.1)

## 2019-12-16 LAB — SARS CORONAVIRUS 2 BY RT PCR (HOSPITAL ORDER, PERFORMED IN ~~LOC~~ HOSPITAL LAB): SARS Coronavirus 2: NEGATIVE

## 2019-12-16 LAB — HEMOGLOBIN A1C
Hgb A1c MFr Bld: 5.5 % (ref 4.8–5.6)
Mean Plasma Glucose: 111.15 mg/dL

## 2019-12-16 LAB — ETHANOL: Alcohol, Ethyl (B): <10 mg/dL (ref <10)

## 2019-12-16 LAB — MAGNESIUM: Magnesium: 2.2 mg/dL (ref 1.7–2.4)

## 2019-12-16 LAB — TSH: TSH: 1.478 u[IU]/mL (ref 0.350–4.500)

## 2019-12-16 MED ORDER — OLANZAPINE 10 MG PO TBDP: 10.0000 mg | ORAL_TABLET | Freq: Three times a day (TID) | ORAL | Status: DC | PRN

## 2019-12-16 MED ORDER — OLANZAPINE 5 MG PO TBDP
5.0000 mg | ORAL_TABLET | Freq: Two times a day (BID) | ORAL | Status: DC
  Administered 2019-12-16 – 2019-12-17 (×3): 5 mg via ORAL
  Filled 2019-12-16 (×8): qty 1

## 2019-12-16 MED ORDER — MAGNESIUM HYDROXIDE 400 MG/5ML PO SUSP
30.0000 mL | Freq: Every day | ORAL | Status: DC | PRN
Start: 2019-12-16 — End: 2019-12-21

## 2019-12-16 MED ORDER — LORAZEPAM 1 MG PO TABS
1.0000 mg | ORAL_TABLET | ORAL | Status: AC | PRN
  Administered 2019-12-16: 1 mg via ORAL
  Filled 2019-12-16: qty 1

## 2019-12-16 MED ORDER — ALUM & MAG HYDROXIDE-SIMETH 200-200-20 MG/5ML PO SUSP: 30.0000 mL | ORAL | Status: DC | PRN

## 2019-12-16 MED ORDER — ZIPRASIDONE MESYLATE 20 MG IM SOLR: 20.0000 mg | INTRAMUSCULAR | Status: DC | PRN

## 2019-12-16 MED ORDER — ACETAMINOPHEN 325 MG PO TABS: 650.0000 mg | ORAL_TABLET | Freq: Four times a day (QID) | ORAL | Status: DC | PRN

## 2019-12-16 NOTE — Tx Team (Signed)
Initial Treatment Plan 12/16/2019 8:11 PM [name] SXJ:[id number]    PATIENT STRESSORS: Other: relationship problems   PATIENT STRENGTHS: Ability for insight Communication skills   PATIENT IDENTIFIED PROBLEMS: Substance use"  "Anxiety"                   DISCHARGE CRITERIA:  Improved stabilization in mood, thinking, and/or behavior Motivation to continue treatment in a less acute level of care  PRELIMINARY DISCHARGE PLAN: Return to previous living arrangement  PATIENT/FAMILY INVOLVEMENT: This treatment plan has been presented to and reviewed with the patient, Richard Krueger, and/or family member, .  The patient and family have been given the opportunity to ask questions and make suggestions.  Curly Rim, RN 12/16/2019, 8:11 PM

## 2019-12-16 NOTE — Progress Notes (Signed)
D: Pt denies SI/HI/AV hallucinations. Pt is blaming others for his being in the hospital. Patient expresses a desire to go home however is hyper verbal and has pressured speech. Patient does follow commands and is redirectable at times. A: Pt was offered support and encouragement.  Q 15 minute checks were done for safety.  R: Pt has no complaints.Pt not receptive to treatment and safety maintained on unit.
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
## 2019-12-16 NOTE — H&P (Addendum)
[facility] Provider Admission PAA/H&P  Patient Identification: Richard Krueger MRN:  [id number] Date of Evaluation:  12/16/2019 Chief Complaint:  Auditory or visual hallucinations Principal Diagnosis: Brief psychotic disorder ([place]) Diagnosis:  Principal Problem:   Brief psychotic disorder ([place])  History of Present Illness:   Richard Krueger is an 54 y.o. male who presents under IVC brought in by [employer]. Per IVC, initiated by patient's ex-wife, states: "Respondent is reported manic and unstable. Having hallucinations claiming that he is God and will deliver his message to people. Claimed he died this past 12/01/2022. Respondent has access to multiple firearms and ammunition. Is taking illegal substance daily. Is a danger to himself and others."  Pt is anxious/irritated/angry during assessment. He goes off on a tangent when asked any question. He appears manic with pressured speech and flight of ideas. Pt denies everything on IVC and states he has always had a frictional relationship with his sons mother which made her file the papers. Patient reports during childhood he went to a "shrink" and took Celexa 20 years ago. 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 Pt reports he uses ecstacy and THC socially. Pt states he sleeps 1 and half to 3 hours daily and has a fair appetite.  During evaluation pt is standing; he is alert/oriented x 4; calm/cooperative; and mood congruent with affect. Pt is speaking in a clear tone at moderate volume, and normal pace; with fair eye contact. His thought process is coherent and relevant; There is no indication that he is currently responding to internal/external stimuli or experiencing delusional thought content.  Pt's insight and judgement is fair,  impulse control is poor at this time.   Associated Signs/Symptoms: Depression Symptoms:  difficulty concentrating, anxiety, (Hypo) Manic Symptoms:  Elevated Mood, Flight of Ideas, Irritable Mood, Anxiety Symptoms:  Excessive Worry, Psychotic Symptoms:  Denies PTSD Symptoms: NA Total Time spent with patient: 30 minutes  Past Psychiatric History: Denies  Is the patient at risk to self? No.  Has the patient been a risk to self in the past 6 months? No.  Has the patient been a risk to self within the distant past? No.  Is the patient a risk to others? No.  Has the patient been a risk to others in the past 6 months? No.  Has the patient been a risk to others within the distant past? No.   Prior Inpatient Therapy: Prior Inpatient Therapy: No Prior Outpatient Therapy: Prior Outpatient Therapy: No Does patient have an ACCT team?: No Does patient have Intensive In-House Services?  : No Does patient have [facility] services? : No Does patient have P4CC services?: No  Alcohol Screening: 1. How often do you have a drink containing alcohol?: Monthly or less 2. How many drinks containing alcohol do you have on a typical day when you are drinking?: 1 or 2 3. How often do you have six or more drinks on one occasion?: Never AUDIT-C Score: 1 4. How often during the last year have you found that you were not able to stop drinking once you had started?: Never 5. How often during the last year have you failed to do what was normally expected from you becasue of drinking?: Never 6. How often during the last year have you needed a first drink  in the morning to get yourself going after a heavy drinking session?: Never 7. How often during the last year have you had a feeling of guilt of remorse after drinking?: Never 8. How often during the last year have you been unable to remember what happened the night before because you had been drinking?: Never 9. Have you or someone else been injured as a result of  your drinking?: No 10. Has a relative or friend or a doctor or another health worker been concerned about your drinking or suggested you cut down?: No Alcohol Use Disorder Identification Test Final Score (AUDIT): 1 Alcohol Brief Interventions/Follow-up: AUDIT Score <7 follow-up not indicated Substance Abuse History in the last 12 months:  Yes.   Consequences of Substance Abuse: psychosis Previous Psychotropic Medications: No  Psychological Evaluations: Yes  Past Medical History: History reviewed. No pertinent past medical history.  Past Surgical History:  Procedure Laterality Date  . ankle repair    . ORIF DISTAL FEMUR FRACTURE     Family History: History reviewed. No pertinent family history. Family Psychiatric History: unknown Tobacco Screening:   Social History:  Social History   Substance and Sexual Activity  Alcohol Use Yes  . Alcohol/week: 1.0 - 2.0 standard drinks  . Types: 1 - 2 Shots of liquor per week     Social History   Substance and Sexual Activity  Drug Use No    Additional Social History: Marital status: Divorced    Pain Medications: see MAR Prescriptions: see MAR Over the Counter: see MAR History of alcohol / drug use?: No history of alcohol / drug abuse                    Allergies:   Allergies  Allergen Reactions  . Sulfa Antibiotics Other (See Comments)    Swollen muscles, stiff joints and break out   Lab Results:  Results for orders placed or performed during the hospital encounter of 12/15/19 (from the past 48 hour(s))  SARS Coronavirus 2 by RT PCR (hospital order, performed in [facility] hospital lab) Nasopharyngeal Nasopharyngeal Swab     Status: None   Collection Time: 12/15/19 10:58 PM   Specimen: Nasopharyngeal Swab  Result Value Ref Range   SARS Coronavirus 2 NEGATIVE NEGATIVE    Comment: (NOTE) SARS-CoV-2 target nucleic acids are NOT DETECTED. The SARS-CoV-2 RNA is generally detectable in upper and lower respiratory  specimens during the acute phase of infection. The lowest concentration of SARS-CoV-2 viral copies this assay can detect is 250 copies / mL. A negative result does not preclude SARS-CoV-2 infection and should not be used as the sole basis for treatment or other patient management decisions.  A negative result may occur with improper specimen collection / handling, submission of specimen other than nasopharyngeal swab, presence of viral mutation(s) within the areas targeted by this assay, and inadequate number of viral copies (<250 copies / mL). A negative result must be combined with clinical observations, patient history, and epidemiological information. Fact Sheet for Patients:   BoilerBrush.com.cy Fact Sheet for Healthcare Providers: https://pope.com/ This test is not yet approved or cleared  by the [place] FDA and has been authorized for detection and/or diagnosis of SARS-CoV-2 by FDA under an Emergency Use Authorization (EUA).  This EUA will remain in effect (meaning this test can be used) for the duration of the COVID-19 declaration under Section 564(b)(1) of the Act, 21 U.S.C. section 360bbb-3(b)(1), unless the authorization is terminated or revoked sooner. Performed at  [facility], [address], [place], [place] [address]     Blood Alcohol level:  No results found for: [facility]  Metabolic Disorder Labs:  No results found for: HGBA1C, MPG No results found for: PROLACTIN No results found for: CHOL, TRIG, HDL, CHOLHDL, VLDL, LDLCALC  Current Medications: No current facility-administered medications for this encounter.   PTA Medications: Medications Prior to Admission  Medication Sig Dispense Refill Last Dose  . acetaminophen-codeine (TYLENOL #3) 300-30 MG tablet Take 1 tablet by mouth every 12 (twelve) hours as needed for moderate pain. 30 tablet 0 Unknown at Unknown time  . predniSONE (DELTASONE) 20  MG tablet Take 1 tablet (20 mg total) by mouth daily with breakfast. 5 tablet 0 Unknown at Unknown time    Musculoskeletal: Strength & Muscle Tone: within normal limits Gait & Station: normal Patient leans: N/A  Psychiatric Specialty Exam: Physical Exam  Constitutional: He is oriented to person, place, and time. He appears well-developed and well-nourished.  HENT:  Head: Normocephalic.  Eyes: Pupils are equal, round, and reactive to light.  Respiratory: Effort normal.  Musculoskeletal:        General: Normal range of motion.     Cervical back: Normal range of motion.  Neurological: He is alert and oriented to person, place, and time.  Skin: Skin is warm and dry.  Psychiatric: Thought content normal. His mood appears anxious. His affect is angry. His speech is tangential. He is agitated and hyperactive. Cognition and memory are normal. He expresses impulsivity.    Review of Systems  Blood pressure (!) 119/98, pulse (!) 106, temperature 98 F (36.7 C), temperature source Oral, resp. rate 20, SpO2 100 %.There is no height or weight on file to calculate BMI.  General Appearance: NA  Eye Contact:  Fair  Speech:  Pressured  Volume:  Increased  Mood:  Angry, Anxious and Irritable  Affect:  Congruent  Thought Process:  Coherent and Descriptions of Associations: Tangential  Orientation:  Full (Time, Place, and Person)  Thought Content:  Logical  Suicidal Thoughts:  No  Homicidal Thoughts:  No  Memory:  Recent;   Good  Judgement:  Fair  Insight:  Fair  Psychomotor Activity:  Increased and Restlessness  Concentration:  Concentration: Fair  Recall:  Good  Fund of Knowledge:  Good  Language:  Good  Akathisia:  No  Handed:  Right  AIMS (if indicated):     Assets:  Communication Skills Desire for Improvement Financial Resources/Insurance Housing Vocational/Educational  ADL's:  Intact  Cognition:  WNL  Sleep:      Disposition: Recommend overnight observation for monitoring  and stabilization Supportive therapy provided about ongoing stressors.   Treatment Plan Summary: Daily contact with patient to assess and evaluate symptoms and progress in treatment and Medication management  Observation Level/Precautions:  15 minute checks Laboratory:  Chemistry Profile UDS Psychotherapy:   Medications:   Consultations:   Discharge Concerns:   Estimated LOS: Other:      Wandra Arthurs, NP 5/12/20212:27 AM

## 2019-12-16 NOTE — Progress Notes (Signed)
Patient ID: Richard Krueger, male   DOB: 1966-03-02, 54 y.o.   MRN: [id number] Pt A&O x 4, anxious and cooperative at present.  No distress noted, monitoring for safety.

## 2019-12-16 NOTE — Plan of Care (Signed)
[facility] Observation Crisis Plan  Reason for Crisis Plan:  Crisis Stabilization   Plan of Care:  Referral for Inpatient Hospitalization  Family Support:      Current Living Environment:  Living Arrangements: Children  Insurance:   Hospital Account    Name Acct ID Class Status Primary Coverage   Richard Krueger, Richard Krueger [id number] BEHAVIORAL HEALTH OBSERVATION Open [employer] [facility] [place] [facility]        Guarantor Account (for Hospital Account [id number])    Name Relation to Pt Service Area Active? Acct Type   Richard Krueger Self [facility] Yes Behavioral Health   Address Phone       [address] [place], [place] [address] [phone](H)          Coverage Information (for Hospital Account [id number])    F/O Payor/Plan Precert #   [facility]    Subscriber Subscriber #   Richard, Krueger [id number]   Address Phone   [address] [place], [place] [address] [phone]      Legal Guardian:  Legal Guardian: (self)  Primary Care Provider:  Patient, No Pcp Per  Current Outpatient Providers:  none  Psychiatrist:  Name of Psychiatrist: denies  Counselor/Therapist:  Name of Therapist: denies  Compliant with Medications:  No  Additional Information:   Richard Krueger 5/12/20211:10 AM

## 2019-12-16 NOTE — Progress Notes (Signed)
Patient ID: Richard Krueger, male   DOB: December 02, 1965, 54 y.o.   MRN: [id number] Pt A&O x 4, pt under IVC by mother of his child.  Pt religiously preoccupied and paranoid, claiming he is God.  Pt has access to firearms.  Pt anxious and irritable.  Skin search completed, monitoring for safety.  No distress noted.  Pt cooperative at present.

## 2019-12-17 DIAGNOSIS — F1011 Alcohol abuse, in remission: Secondary | ICD-10-CM | POA: Diagnosis present

## 2019-12-17 DIAGNOSIS — F17210 Nicotine dependence, cigarettes, uncomplicated: Secondary | ICD-10-CM | POA: Diagnosis present

## 2019-12-17 DIAGNOSIS — B1920 Unspecified viral hepatitis C without hepatic coma: Secondary | ICD-10-CM | POA: Diagnosis present

## 2019-12-17 DIAGNOSIS — Z882 Allergy status to sulfonamides status: Secondary | ICD-10-CM | POA: Diagnosis not present

## 2019-12-17 DIAGNOSIS — F3110 Bipolar disorder, current episode manic without psychotic features, unspecified: Secondary | ICD-10-CM | POA: Diagnosis not present

## 2019-12-17 DIAGNOSIS — F419 Anxiety disorder, unspecified: Secondary | ICD-10-CM | POA: Diagnosis present

## 2019-12-17 DIAGNOSIS — G4700 Insomnia, unspecified: Secondary | ICD-10-CM | POA: Diagnosis present

## 2019-12-17 DIAGNOSIS — Y900 Blood alcohol level of less than 20 mg/100 ml: Secondary | ICD-10-CM | POA: Diagnosis present

## 2019-12-17 DIAGNOSIS — Z9114 Patient's other noncompliance with medication regimen: Secondary | ICD-10-CM | POA: Diagnosis not present

## 2019-12-17 DIAGNOSIS — Z20822 Contact with and (suspected) exposure to covid-19: Secondary | ICD-10-CM | POA: Diagnosis present

## 2019-12-17 DIAGNOSIS — F319 Bipolar disorder, unspecified: Secondary | ICD-10-CM | POA: Diagnosis present

## 2019-12-17 DIAGNOSIS — F23 Brief psychotic disorder: Secondary | ICD-10-CM | POA: Diagnosis present

## 2019-12-17 NOTE — Progress Notes (Signed)
Patient ID: Nesbit Michon, male   DOB: 1966/05/22, 54 y.o.   MRN: [id number] [facility] Admission Note  Pt is a 54 yo male that presents IVC'd on 12/17/2019 with worsening "deja vu" occurrences. Pt states these started when they were 14 and started to become more common recently. Pt states they have very vivid dreams "because my IQ is higher than most". Pt states they will see things in their dreams and then they will see these events play out in their life. Pt states they told their ex wife these things and feelings of "feeling like God". Pt states that is how the patient came to be IVC'd. Pt became angry with this story and stated they wanted revenge in the form of back child support placed on the ex wife once they leave. Pt states they have a "good job" and they live with their son. Pt also states they have a few close friends as support. Pt denies a pcp and seems angry with the thought of follow up. Pt endorses childhood trauma. Pt seems to voice the use of substance use/abuse. pt is open to taking medication but doesn't want to feel sedated with work. Pt denies current si/hi/ah/vh and verbally agrees to approach staff if these become apparent or before harming themself/others while at bhh. Consents signed, skin/belongings search completed and patient oriented to unit. Patient stable at this time. Patient given the opportunity to express concerns and ask questions. Patient given toiletries. Will continue to monitor.    [facility] Assessment 12/15/2019:  Tomaz Janis is an 54 y.o. male presenting to [facility] via [employer] under IVC. Per IVC, initiated by patient's ex-wife, states: "Respondent is reported manic and unstable. Having hallucinations claiming that he is God and will deliver his message to people. Claimed he died this past Dec 18, 2022. Respondent has access to multiple firearms and ammunition. Is taking illegal substance daily. Is a danger to himself and others."  Patient is irritable upon exam but  cooperative. He appears manic. It is unclear if it is substance induced at time of assessment. Patient provides unreliable history due to AMS. His speech is rapid and he has flight of ideas. Patient reports in childhood he went to a "shrink" and took Celexa 20 years ago. He denies any other mental health history. He denies SI/HI. 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  He reports "premonitions" but denies any hallucinations or paranoia. Patient is grandiose, stating his IQ is 150, and admittedly has a "God complex." Patient reports occasional THC use. He also endorses using substance at "raves" such as MDMA. Patient gives verbal consent for TTS to contact his friend, Arlys John at [phone]. This counselor attempted to reach collateral without success.

## 2019-12-17 NOTE — Progress Notes (Signed)
   12/17/19 2045  Psych Admission Type (Psych Patients Only)  Admission Status Involuntary  Psychosocial Assessment  Patient Complaints Anxiety  Eye Contact Brief  Facial Expression Anxious  Affect Anxious  Speech Rapid;Pressured  Interaction Assertive  Motor Activity Restless  Appearance/Hygiene Unremarkable;In scrubs  Behavior Characteristics Cooperative;Anxious  Mood Anxious  Thought Process  Coherency WDL  Content Delusions  Delusions Grandeur  Perception WDL  Hallucination None reported or observed  Judgment Impaired  Confusion None  Danger to Self  Current suicidal ideation? Denies  Danger to Others  Danger to Others None reported or observed

## 2019-12-17 NOTE — Tx Team (Signed)
Initial Treatment Plan 12/17/2019 5:15 PM [name] [id number]    PATIENT STRESSORS: Health problems Substance abuse   PATIENT STRENGTHS: Ability for insight Average or above average intelligence Capable of independent living Financial means Physical Health Supportive family/friends   PATIENT IDENTIFIED PROBLEMS: Grandiose thoughts  "deja vu" incidences   anger  agression               DISCHARGE CRITERIA:  Ability to meet basic life and health needs Improved stabilization in mood, thinking, and/or behavior Motivation to continue treatment in a less acute level of care Need for constant or close observation no longer present  PRELIMINARY DISCHARGE PLAN: Attend aftercare/continuing care group Attend PHP/IOP Participate in family therapy Return to previous living arrangement Return to previous work or school arrangements  PATIENT/FAMILY INVOLVEMENT: This treatment plan has been presented to and reviewed with the patient, Richard Krueger.  The patient and family have been given the opportunity to ask questions and make suggestions.  Raylene Miyamoto, RN 12/17/2019, 5:15 PM

## 2019-12-17 NOTE — Progress Notes (Signed)
  COVID-19 Daily Checkoff  Have you had a fever (temp > 37.80C/100F)  in the past 24 hours?  No  If you have had runny nose, nasal congestion, sneezing in the past 24 hours, has it worsened? No  COVID-19 EXPOSURE  Have you traveled outside the state in the past 14 days? No  Have you been in contact with someone with a confirmed diagnosis of COVID-19 or PUI in the past 14 days without wearing appropriate PPE? No  Have you been living in the same home as a person with confirmed diagnosis of COVID-19 or a PUI (household contact)? No  Have you been diagnosed with COVID-19? No               D:  Patient presents talkative, rapid speech, preoccupied with religion. Cooperative with staff. Pt was running shower in room while laying in bed resting. Patient stated, "that's the only way I can get warm, by letting the steam heat up the room". RN showed pt how to operate the heating unit in room. Patient states, "It don't work better than good old fashion steam". Patient denies SI/HI, denies A/VH. Denies physical complaints when asked. At present patient rates his mood #10 (0-10).   A: Support and encouragement provided. Routine safety checks conducted every 15 minutes per unit protocol. Encouraged patient to notify staff if thoughts of harm toward self or others arise. Patient verbalized agreement.   R: Patient remains safe at this time, patient verbally contracts for safety at this time. Will continue to monitor.

## 2019-12-17 NOTE — BH Assessment (Addendum)
[facility] Assessment Progress Note  Per Berneice Heinrich, FNP, this pt requires psychiatric hospitalization.  [name] has assigned pt to [facility] Rm [id number].  Pt presents under IVC initiated by the mother of pt's child, and upheld by Caryn Bee.  Documents may be found on pt's chart.  Pt's nurse, Vernona Rieger, has been notified.   Doylene Canning, [place] Behavioral Health Coordinator [phone]

## 2019-12-17 NOTE — Progress Notes (Signed)
Pt escorted to 500 unit for continued inpatient care via staff. Safety maintained.

## 2019-12-17 NOTE — [facility] Group Notes (Signed)
[facility] Group Therapy 12/17/2019 2:43 PM  Type of Therapy: Mental Health Association Presentation  Participation Level: Active  Participation Quality: Attentive  Affect: Appropriate  Cognitive: Oriented  Insight: Developing/Improving  Engagement in Therapy: Engaged  Modes of Intervention: Discussion, Education and Socialization   Summary of Progress/Problems: Mental Health Association (MHA) Speaker came to talk about his personal journey with mental health. The pt processed ways by which to relate to the speaker. MHA speaker provided handouts and educational information pertaining to groups and services offered by the [facility]. Pt was engaged in speaker's presentation and was receptive to resources provided.   Enid Cutter, MSW, [facility] 12/17/2019 2:43 PM

## 2019-12-17 NOTE — Progress Notes (Signed)
Patient states that he learned from a peer today but did not disclose. His goal for tomorrow is to get discharged.

## 2019-12-18 DIAGNOSIS — F3110 Bipolar disorder, current episode manic without psychotic features, unspecified: Secondary | ICD-10-CM

## 2019-12-18 MED ORDER — OLANZAPINE 5 MG PO TBDP
5.0000 mg | ORAL_TABLET | Freq: Every day | ORAL | Status: DC
  Filled 2019-12-18: qty 1

## 2019-12-18 MED ORDER — OLANZAPINE 10 MG PO TBDP
10.0000 mg | ORAL_TABLET | Freq: Every day | ORAL | Status: DC
  Administered 2019-12-18: 10 mg via ORAL
  Filled 2019-12-18 (×2): qty 1

## 2019-12-18 MED ORDER — LITHIUM CARBONATE ER 300 MG PO TBCR
300.0000 mg | EXTENDED_RELEASE_TABLET | Freq: Two times a day (BID) | ORAL | Status: DC
  Administered 2019-12-18 – 2019-12-19 (×3): 300 mg via ORAL
  Filled 2019-12-18 (×6): qty 1
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
## 2019-12-18 NOTE — Progress Notes (Signed)
Recreation Therapy Notes  INPATIENT RECREATION THERAPY ASSESSMENT  Patient Details Name: Richard Krueger MRN: [id number] DOB: January 12, 1966 Today's Date: 12/18/2019       Information Obtained From: Patient  Able to Participate in Assessment/Interview: Yes  Patient Presentation: Alert  Reason for Admission (Per Patient): Other (Comments)("closed minded, crazy ass baby momma")  Patient Stressors: (None identified)  Coping Skills:   Music, Talk  Leisure Interests (2+):  Social - Friends, Individual - Other (Comment)(Walk dog; Build cars)  Frequency of Recreation/Participation: Other (Comment)("whenever I feel like it")  Awareness of Community Resources:  Yes  Community Resources:  Park  Current Use: Yes  Expressed Interest in [employer] Information: No  [employer] of Residence:  Engineer, technical sales  Patient Main Form of Transportation: Set designer  Patient Strengths:  "whatever I set my mind to"  Patient Identified Areas of Improvement:  Picking better friends  Patient Goal for Hospitalization:  "get out"  Current SI (including self-harm):  No  Current HI:  No  Current AVH: No  Staff Intervention Plan: Collaborate with Interdisciplinary Treatment Team, Group Attendance  Consent to Intern Participation: N/A    Caroll Rancher, LRT/CTRS  Caroll Rancher A 12/18/2019, 12:07 PM

## 2019-12-18 NOTE — Progress Notes (Signed)
Patient has been up and active on the unit.  He attended group and was compliant with his medications. An order for lithium was to be given with his zyprexa tonight and he reports that is not what he and the doctor discussed. He has been  hyper verbal but pleasant tonight. He has been up and down after medications received tonight. He reports that the lithium is counteracting with his zyprexa and that I why he can't sleep.

## 2019-12-18 NOTE — Progress Notes (Signed)
   12/18/19 2115  COVID-19 Daily Checkoff  Have you had a fever (temp > 37.80C/100F)  in the past 24 hours?  No  If you have had runny nose, nasal congestion, sneezing in the past 24 hours, has it worsened? No  COVID-19 EXPOSURE  Have you traveled outside the state in the past 14 days? No  Have you been in contact with someone with a confirmed diagnosis of COVID-19 or PUI in the past 14 days without wearing appropriate PPE? No  Have you been living in the same home as a person with confirmed diagnosis of COVID-19 or a PUI (household contact)? No  Have you been diagnosed with COVID-19? No

## 2019-12-18 NOTE — Progress Notes (Signed)
Recreation Therapy Notes  Date: 5.14.21 Time: 1000 Location: [address]  Group Topic: Communication, Team Building, Problem Solving  Goal Area(s) Addresses:  Patient will effectively work with peer towards shared goal.  Patient will identify skill used to make activity successful.  Patient will identify how skills used during activity can be used to reach post d/c goals.   Behavioral Response: Engaged  Intervention: STEM Activity   Activity:  In groups, patients were asked to build a freestanding tower as tall as they could that could stand on its own using 12 pipe cleaners.  Along the way, the groups will experience budget cuts in which they will lose the use of one hand and the ability to talk to each other.  Patients will regain the use of these functions when money is added back to the budget.  Education: Pharmacist, community, Building control surveyor.   Education Outcome: Acknowledges education  Clinical Observations/Feedback: Pt exhibited some manic behaviors but was able to be redirected as needed.  Pt was able to complete the tower with his peers.  Pt felt the groups used espionage to complete the task because "they were looking to see what each other was doing".    Caroll Rancher, LRT/CTRS     Caroll Rancher A 12/18/2019 11:47 AM

## 2019-12-18 NOTE — H&P (Addendum)
Psychiatric Admission Assessment Adult  Patient Identification: Sonia Stickels MRN:  [id number] Date of Evaluation:  12/18/2019 Chief Complaint:  Brief psychotic disorder ([place]) [F23] Principal Diagnosis: Bipolar I disorder, most recent episode (or current) manic ([place]) Diagnosis:  Principal Problem:   Bipolar I disorder, most recent episode (or current) manic ([place])  History of Present Illness: Patient is a 54 year old male that presented as a walk-in under IVC and he was petitioned by his ex-wife.  Patient presented to be agitated, irritable, angry, and psychomotor agitation. Patient reports that he has been feeling great for the last couple of weeks.  He states that he woke up 1 morning in December and his mind is seem to be clear.  He states that in the past he was a very timid and shy person and did not express himself to anyone.  He states that when he woke up in December he just realized how alive and awoke he was.  He states that approximately around the age of 88 when he is having surgery in the hospital he had a dreamlike event and then reexperienced the same event in life.  He states that has been happening more over the last 2 weeks and it has come to his decision that he could possibly be God.  He feels that he needs to learn more about the rapture because he is not a religious person but if he is God then he needs to be able to help prepare people.  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  Patient also reports various drug use from time to time and sometimes not even knowing what the drug is.  He also reports a chronic history of alcohol abuse.  He reports that he used to drink his problems away but now he only drinks with friends daily but goes to approximately a half a gallon of liquor, preferably white liquor, per week.  He reports that he is not been  sleeping very much but feels that this is not an issue.  He does state that it has been nice to lay down and get sleep for a few hours consistently and states that the Zyprexa has helped with that. Patient presents with rapid speech and tangential conversation.  Patient also reports some sexual inappropriate behavior with sleeping with strippers or hookers on occasion only recently, grandiose thoughts of being God and being a genius.  He states that he has not felt any type of depression or anxiety and that this is the best he has been in his entire life.  He now states that he plans to take his ex-wife back to court because of her having him in voluntary committed and he did not need to be.  He also reports that he was going to raves so that he could educate the younger population on the proper ways to use drugs and what to expect with drugs.  He denies any medical complaints however when questioned about his elevated AST and ALT he reports that he either had hep B or hep C about 20 to 30 years ago but has never received treatment for it.  Associated Signs/Symptoms: Depression Symptoms:  insomnia, (Hypo) Manic Symptoms:  Distractibility, Elevated Mood, Flight of Ideas, Community education officer, Grandiosity, Irritable Mood, Labiality of Mood, Sexually Inapproprite Behavior, Anxiety Symptoms:  Denies Psychotic Symptoms:  Denies PTSD Symptoms: Negative Total Time spent with patient: 1 hour  Past Psychiatric History: Denies any psychiatric hospitalizations.  Only reports Celexa years ago but did not continue it.  Reports 1 previous psychiatric appointment when he was started on Celexa.  Denies any suicide attempts.  Denies any previous diagnoses.  Does report chronic history of alcohol use as well as various substances from time to time.  Is the patient at risk to self? Yes.    Has the patient been a risk to self in the past 6 months? No.  Has the patient been a risk to self within the distant  past? No.  Is the patient a risk to others? Yes.    Has the patient been a risk to others in the past 6 months? No.  Has the patient been a risk to others within the distant past? No.   Prior Inpatient Therapy: Prior Inpatient Therapy: No Prior Outpatient Therapy: Prior Outpatient Therapy: No Does patient have an ACCT team?: No Does patient have Intensive In-House Services?  : No Does patient have [facility] services? : No Does patient have P4CC services?: No  Alcohol Screening: 1. How often do you have a drink containing alcohol?: Monthly or less 2. How many drinks containing alcohol do you have on a typical day when you are drinking?: 1 or 2 3. How often do you have six or more drinks on one occasion?: Never AUDIT-C Score: 1 4. How often during the last year have you found that you were not able to stop drinking once you had started?: Never 5. How often during the last year have you failed to do what was normally expected from you because of drinking?: Never 6. How often during the last year have you needed a first drink in the morning to get yourself going after a heavy drinking session?: Never 7. How often during the last year have you had a feeling of guilt of remorse after drinking?: Never 8. How often during the last year have you been unable to remember what happened the night before because you had been drinking?: Never 9. Have you or someone else been injured as a result of your drinking?: No 10. Has a relative or friend or a doctor or another health worker been concerned about your drinking or suggested you cut down?: No Alcohol Use Disorder Identification Test Final Score (AUDIT): 1 Alcohol Brief Interventions/Follow-up: AUDIT Score <7 follow-up not indicated Substance Abuse History in the last 12 months:  Yes.   Consequences of Substance Abuse: Medical Consequences:  reviewed Legal Consequences:  reviewed Family Consequences:  reviewed Previous Psychotropic Medications: Yes   Psychological Evaluations: Yes  Past Medical History: History reviewed. No pertinent past medical history.  Past Surgical History:  Procedure Laterality Date  . ankle repair    . ORIF DISTAL FEMUR FRACTURE     Family History: History reviewed. No pertinent family history. Family Psychiatric  History: None reported Tobacco Screening:   Social History:  Social History   Substance and Sexual Activity  Alcohol Use Yes  . Alcohol/week: 1.0 - 2.0 standard drinks  . Types: 1 - 2 Shots of liquor per week     Social History   Substance and Sexual Activity  Drug Use No    Additional Social History: Marital status: Divorced    Pain Medications: see MAR Prescriptions: see MAR Over the Counter: see MAR History of alcohol / drug use?: No history of alcohol / drug abuse  Allergies:   Allergies  Allergen Reactions  . Sulfa Antibiotics Other (See Comments)    Swollen muscles, stiff joints and break out   Lab Results: No results found for this or any previous visit (from the past 48 hour(s)).  Blood Alcohol level:  Lab Results  Component Value Date   ETH <10 54/27/0623    Metabolic Disorder Labs:  Lab Results  Component Value Date   HGBA1C 5.5 12/16/2019   MPG 111.15 12/16/2019   No results found for: PROLACTIN Lab Results  Component Value Date   CHOL 195 12/16/2019   TRIG 47 12/16/2019   HDL 71 12/16/2019   CHOLHDL 2.7 12/16/2019   VLDL 9 12/16/2019   LDLCALC 115 (H) 12/16/2019    Current Medications: Current Facility-Administered Medications  Medication Dose Route Frequency Provider Last Rate Last Admin  . acetaminophen (TYLENOL) tablet 650 mg  650 mg Oral Q6H PRN Anike, Adaku C, NP      . alum & mag hydroxide-simeth (MAALOX/MYLANTA) 200-200-20 MG/5ML suspension 30 mL  30 mL Oral Q4H PRN Anike, Adaku C, NP      . lithium carbonate (LITHOBID) CR tablet 300 mg  300 mg Oral Q12H Money, [name], FNP      . magnesium hydroxide (MILK OF  MAGNESIA) suspension 30 mL  30 mL Oral Daily PRN Anike, Adaku C, NP      . OLANZapine zydis (ZYPREXA) disintegrating tablet 10 mg  10 mg Oral Q8H PRN Starkes-Perry, Gayland Curry, FNP       And  . ziprasidone (GEODON) injection 20 mg  20 mg Intramuscular PRN Starkes-Perry, Gayland Curry, FNP      . OLANZapine zydis (ZYPREXA) disintegrating tablet 10 mg  10 mg Oral QHS Money, Lowry Ram, FNP      . [START ON 12/19/2019] OLANZapine zydis (ZYPREXA) disintegrating tablet 5 mg  5 mg Oral Daily Money, [name], FNP       PTA Medications: No medications prior to admission.    Musculoskeletal: Strength & Muscle Tone: within normal limits Gait & Station: normal Patient leans: N/A  Psychiatric Specialty Exam: Physical Exam  Nursing note and vitals reviewed. Constitutional: He is oriented to person, place, and time. He appears well-developed and well-nourished.  Cardiovascular: Normal rate.  Respiratory: Effort normal.  Musculoskeletal:        General: Normal range of motion.  Neurological: He is alert and oriented to person, place, and time.  Skin: Skin is warm.  Psychiatric: His affect is inappropriate. His speech is rapid and/or pressured. He is hyperactive. Thought content is delusional. Cognition and memory are impaired. He expresses impulsivity.    Review of Systems  Constitutional: Negative.   HENT: Negative.   Eyes: Negative.   Respiratory: Negative.   Cardiovascular: Negative.   Gastrointestinal: Negative.   Genitourinary: Negative.   Musculoskeletal: Negative.   Skin: Negative.   Neurological: Negative.   Psychiatric/Behavioral: Negative.     Blood pressure 110/85, pulse 98, temperature 97.8 F (36.6 C), temperature source Oral, resp. rate 18, height _0  (1.753 m), weight 77.1 kg, SpO2 99 %.Body mass index is 25.1 kg/m.  General Appearance: Bizarre and Casual  Eye Contact:  Good  Speech:  Pressured  Volume:  Increased  Mood:  Euphoric  Affect:  Congruent  Thought Process:  Goal  Directed and Descriptions of Associations: Tangential  Orientation:  Full (Time, Place, and Person)  Thought Content:  Rumination, Tangential and Abstract Reasoning  Suicidal Thoughts:  No  Homicidal Thoughts:  No  Memory:  Immediate;   Fair Recent;   Fair Remote;   Fair  Judgement:  Impaired  Insight:  Shallow  Psychomotor Activity:  Increased  Concentration:  Concentration: Fair  Recall:  [employer] of Knowledge:  Fair  Language:  Fair  Akathisia:  No  Handed:  Right  AIMS (if indicated):     Assets:  Desire for Improvement Financial Resources/Insurance Housing Physical Health  ADL's:  Intact  Cognition:  WNL  Sleep:  Number of Hours: 6.5    Treatment Plan Summary: Daily contact with patient to assess and evaluate symptoms and progress in treatment and Medication management  Patient reports that the Zyprexa does assist him with sleep which is the only concern that he had.  With patient continuing to show symptoms of mania will increase Zyprexa to 5 mg every morning and 10 mg nightly.  We will also start lithium 300 mg p.o. twice daily.  Observation Level/Precautions:  15 minute checks  Laboratory:  Reviewed and ordered Hepatitis panel  Psychotherapy: Group therapy  Medications: See MAR  Consultations: As needed  Discharge Concerns: Compliance  Estimated LOS: 3-5 days  Other: Admit to [place] for Primary Diagnosis: Bipolar I disorder, most recent episode (or current) manic ([place]) Long Term Goal(s): Improvement in symptoms so as ready for discharge  Short Term Goals: Ability to identify changes in lifestyle to reduce recurrence of condition will improve, Ability to verbalize feelings will improve, Ability to demonstrate self-control will improve, Ability to identify and develop effective coping behaviors will improve, Ability to maintain clinical measurements within normal limits will improve, Compliance with prescribed medications will improve  and Ability to identify triggers associated with substance abuse/mental health issues will improve  Physician Treatment Plan for Secondary Diagnosis: Principal Problem:   Bipolar I disorder, most recent episode (or current) manic ([place])  Long Term Goal(s): Improvement in symptoms so as ready for discharge  Short Term Goals: Ability to identify changes in lifestyle to reduce recurrence of condition will improve, Ability to verbalize feelings will improve, Ability to demonstrate self-control will improve, Ability to identify and develop effective coping behaviors will improve, Ability to maintain clinical measurements within normal limits will improve, Compliance with prescribed medications will improve and Ability to identify triggers associated with substance abuse/mental health issues will improve  I certify that inpatient services furnished can reasonably be expected to improve the patient's condition.    [place], FNP 5/14/202111:28 AM  I have discussed case with NP and have met with patient  Agree with NP note and assessment   17, divorced, has a 107 year old son who lives with an uncle. Employed. Presented to [facility] via [employer] under IVC . Per IVC "Respondent is reported manic and unstable. Having hallucinations claiming that he is God and will deliver his message to people. Claimed he died this past 2022/10/01. Respondent has access to multiple firearms and ammunition. Is taking illegal substance daily. Is a danger to himself and others." Patient describes having premonitions . He  reports " I envisioned these things happening 40 years ago". States for example that 30 + years ago he had a dream about a sexual encounter which occurred " in an identical way of the way I saw it". States " I have a lot of gifts, because I am ridiculously  intelligent. My mind runs very fast , it catalogs everything". States " I can do all your jobs simultaneously". He reports being irritated and  angry about being  brought to hospital and states " I am intending to sue some people", but denies any violent or homicidal ideations.  He denies any suicidal ideations, and states " I love my life ". Denies prior psychiatric admissions . Denies history of psychiatric illness . He reports past history of being treated with Celexa in the past in the context of anxiety related to marital discord. States he did not tolerate it well so stopped after several weeks. He currently does not endorse prior history of Bipolar Spectrum Disorder or diagnosis. Reports occasional use of " Rave" drugs. States he has used " mushrooms",MDMA- last drug use was 2-3 weeks ago. Denies alcohol abuse, states he drinks up to a gallon of liquor with friends about once a week. Admission BAL <10.  Denies medical illnesses . History of R ankle fractures related to fall several years ago. .Allergic to Sulfa Antibiotics . Was not taking any medications prior to admission.  Labs reviewed- AST and ALT elevated .   Currently patient is not presenting with symptoms of WDL- BP is 110/85, pulse 98. No tremors , no diaphoresis.   Dx- Bipolar Disorder, Manic  Plan- Inpatient admission. He has been started on Zyprexa 10 mgrs QHS and on Lithium 300 mgrs BID ( creatinine 0.96)  LFTs noted to be elevated ( AST 137, ALT 187) . Will order hepatitis panel . As above, patient reports intermittent binge drinking. Admission BAL negative. Ativan PRN for alcohol WDL if needed, as per CIWA protocol Recheck LFTs in AM

## 2019-12-18 NOTE — Tx Team (Signed)
Interdisciplinary Treatment and Diagnostic Plan Update  12/18/2019 Time of Session: 9:20am Richard Krueger MRN: [id number]  Principal Diagnosis: Bipolar I disorder, most recent episode (or current) manic ([place])  Secondary Diagnoses: Principal Problem:   Bipolar I disorder, most recent episode (or current) manic ([name])   Current Medications:  Current Facility-Administered Medications  Medication Dose Route Frequency Provider Last Rate Last Admin  . acetaminophen (TYLENOL) tablet 650 mg  650 mg Oral Q6H PRN Anike, Adaku C, NP      . alum & mag hydroxide-simeth (MAALOX/MYLANTA) 200-200-20 MG/5ML suspension 30 mL  30 mL Oral Q4H PRN Anike, Adaku C, NP      . lithium carbonate (LITHOBID) CR tablet 300 mg  300 mg Oral Q12H Money, [name], FNP   300 mg at 12/18/19 1209  . magnesium hydroxide (MILK OF MAGNESIA) suspension 30 mL  30 mL Oral Daily PRN Anike, Adaku C, NP      . OLANZapine zydis (ZYPREXA) disintegrating tablet 10 mg  10 mg Oral Q8H PRN Starkes-Perry, Gayland Curry, FNP       And  . ziprasidone (GEODON) injection 20 mg  20 mg Intramuscular PRN Starkes-Perry, Gayland Curry, FNP      . OLANZapine zydis (ZYPREXA) disintegrating tablet 10 mg  10 mg Oral QHS Money, Lowry Ram, FNP      . [START ON 12/19/2019] OLANZapine zydis (ZYPREXA) disintegrating tablet 5 mg  5 mg Oral Daily Money, [name], FNP       PTA Medications: No medications prior to admission.    Patient Stressors: Health problems Substance abuse  Patient Strengths: Ability for insight Average or above average intelligence Capable of independent living Scientist, research (life sciences) Physical Health Supportive family/friends  Treatment Modalities: Medication Management, Group therapy, Case management,  1 to 1 session with clinician, Psychoeducation, Recreational therapy.   Physician Treatment Plan for Primary Diagnosis: Bipolar I disorder, most recent episode (or current) manic ([name]) Long Term Goal(s): Improvement in symptoms so as ready  for discharge Improvement in symptoms so as ready for discharge   Short Term Goals: Ability to identify changes in lifestyle to reduce recurrence of condition will improve Ability to verbalize feelings will improve Ability to demonstrate self-control will improve Ability to identify and develop effective coping behaviors will improve Ability to maintain clinical measurements within normal limits will improve Compliance with prescribed medications will improve Ability to identify triggers associated with substance abuse/mental health issues will improve Ability to identify changes in lifestyle to reduce recurrence of condition will improve Ability to verbalize feelings will improve Ability to demonstrate self-control will improve Ability to identify and develop effective coping behaviors will improve Ability to maintain clinical measurements within normal limits will improve Compliance with prescribed medications will improve Ability to identify triggers associated with substance abuse/mental health issues will improve  Medication Management: Evaluate patient's response, side effects, and tolerance of medication regimen.  Therapeutic Interventions: 1 to 1 sessions, Unit Group sessions and Medication administration.  Evaluation of Outcomes: Not Met  Physician Treatment Plan for Secondary Diagnosis: Principal Problem:   Bipolar I disorder, most recent episode (or current) manic ([name])  Long Term Goal(s): Improvement in symptoms so as ready for discharge Improvement in symptoms so as ready for discharge   Short Term Goals: Ability to identify changes in lifestyle to reduce recurrence of condition will improve Ability to verbalize feelings will improve Ability to demonstrate self-control will improve Ability to identify and develop effective coping behaviors will improve Ability to maintain clinical measurements within  normal limits will improve Compliance with prescribed medications  will improve Ability to identify triggers associated with substance abuse/mental health issues will improve Ability to identify changes in lifestyle to reduce recurrence of condition will improve Ability to verbalize feelings will improve Ability to demonstrate self-control will improve Ability to identify and develop effective coping behaviors will improve Ability to maintain clinical measurements within normal limits will improve Compliance with prescribed medications will improve Ability to identify triggers associated with substance abuse/mental health issues will improve     Medication Management: Evaluate patient's response, side effects, and tolerance of medication regimen.  Therapeutic Interventions: 1 to 1 sessions, Unit Group sessions and Medication administration.  Evaluation of Outcomes: Not Met   RN Treatment Plan for Primary Diagnosis: Bipolar I disorder, most recent episode (or current) manic ([name]) Long Term Goal(s): Knowledge of disease and therapeutic regimen to maintain health will improve  Short Term Goals: Ability to identify and develop effective coping behaviors will improve and Compliance with prescribed medications will improve  Medication Management: RN will administer medications as ordered by provider, will assess and evaluate patient's response and provide education to patient for prescribed medication. RN will report any adverse and/or side effects to prescribing provider.  Therapeutic Interventions: 1 on 1 counseling sessions, Psychoeducation, Medication administration, Evaluate responses to treatment, Monitor vital signs and CBGs as ordered, Perform/monitor CIWA, COWS, AIMS and Fall Risk screenings as ordered, Perform wound care treatments as ordered.  Evaluation of Outcomes: Not Met   LCSW Treatment Plan for Primary Diagnosis: Bipolar I disorder, most recent episode (or current) manic ([place]) Long Term Goal(s): Safe transition to appropriate next level of  care at discharge, Engage patient in therapeutic group addressing interpersonal concerns.  Short Term Goals: Engage patient in aftercare planning with referrals and resources  Therapeutic Interventions: Assess for all discharge needs, 1 to 1 time with Social worker, Explore available resources and support systems, Assess for adequacy in community support network, Educate family and significant other(s) on suicide prevention, Complete Psychosocial Assessment, Interpersonal group therapy.  Evaluation of Outcomes: Not Met   Progress in Treatment: Attending groups: Yes. Participating in groups: Yes. Taking medication as prescribed: Yes. Toleration medication: Yes. Family/Significant other contact made: No, will contact:  when given consent. Patient understands diagnosis: No. Discussing patient identified problems/goals with staff: Yes. Medical problems stabilized or resolved: Yes. Denies suicidal/homicidal ideation: Yes. Issues/concerns per patient self-inventory: No. Other: none  New problem(s) identified: No, Describe:  none  New Short Term/Long Term Goal(s):  Patient Goals:   "No goals"  Discharge Plan or Barriers:   Reason for Continuation of Hospitalization: Mania Medication stabilization  Estimated Length of Stay: 1-3 days.   Attendees: Patient: Jovannie Ulibarri 12/18/2019   Physician:  12/18/2019   Nursing:  12/18/2019   RN Care Manager: 12/18/2019   Social Worker: Darletta Moll, Latanya Presser 12/18/2019   Recreational Therapist:  12/18/2019   Other: Marvia Pickles, [place] 12/18/2019   Other: Lurline Idol, LCSW 12/18/2019   Other: 12/18/2019     Scribe for Treatment Team: Vassie [place], LCSW 12/18/2019 1:09 PM
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
## 2019-12-18 NOTE — [facility] Suicide Risk Assessment (Signed)
[facility] INPATIENT:  Family/Significant Other Suicide Prevention Education  Suicide Prevention Education:  Education Completed; Clearence Cheek, Sharee Holster [phone]) has been identified by the patient as the person(s) who will aid the patient in the event of a mental health crisis (suicidal ideations/suicide attempt).  With written consent from the patient, the family member/significant other/friend has been provided the following suicide prevention education, prior to the and/or following the discharge of the patient.  The suicide prevention education provided includes the following:  Suicide risk factors  Suicide prevention and interventions  National Suicide Hotline telephone number  [facility] assessment telephone number  [facility] Emergency Assistance 911  [facility] and/or Residential Mobile Crisis Unit telephone number  Request made of family/significant other to:  Remove weapons (e.g., guns, rifles, knives), all items previously/currently identified as safety concern.    Remove drugs/medications (over-the-counter, prescriptions, illicit drugs), all items previously/currently identified as a safety concern.  The family member/significant other verbalizes understanding of the suicide prevention education information provided.  The family member/significant other agrees to remove the items of safety concern listed above.    Per Arlys John pt has been acting "different" since Sunday 12/13/2019. Arlys John stated that he has been having manic/psychotic episode, which he first believed was substance induced from Ketamine being used, but no longer feels that this is the case. Arlys John stated that Swan has had several grandiose delusions believing that he is god and that he "created everything."   Arlys John stated that he was able to meet the police at pt's address on Tuesday 12/15/19 and was able to remove all guns and weapons from the premises. Arlys John reported that he also took  Klonopin from his home and took his car keys. Arlys John stated that he does not feel safe with Molly Maduro returning to the community and does not feel that he needs to have access to a car at this point.   Ruthann Cancer MSW, [employer] Clincal Social Worker  [facility]

## 2019-12-18 NOTE — [facility] Suicide Risk Assessment (Addendum)
[facility] Admission Suicide Risk Assessment   Nursing information obtained from:  Patient Demographic factors:  Male Current Mental Status:  NA Loss Factors:  NA Historical Factors:  Impulsivity Risk Reduction Factors:  Employed, Positive social support, Responsible for children under 54 years of age, Living with another person, especially a relative  Total Time spent with patient: 45 minutes Principal Problem: Bipolar Disorder, Manic  Diagnosis:  Principal Problem:   Bipolar I disorder, most recent episode (or current) manic (HCC)  Subjective Data:   Continued Clinical Symptoms:  Alcohol Use Disorder Identification Test Final Score (AUDIT): 1 The "Alcohol Use Disorders Identification Test", Guidelines for Use in Primary Care, Second Edition.  World Science writer [facility]). Score between 0-7:  no or low risk or alcohol related problems. Score between 8-15:  moderate risk of alcohol related problems. Score between 16-19:  high risk of alcohol related problems. Score 20 or above:  warrants further diagnostic evaluation for alcohol dependence and treatment.   CLINICAL FACTORS:  64, divorced, has a 43 year old son who lives with an uncle. Employed. Presented to [facility] via [employer] under IVC . Per IVC "Respondent is reported manic and unstable. Having hallucinations claiming that he is God and will deliver his message to people. Claimed he died this past December 02, 2022. Respondent has access to multiple firearms and ammunition. Is taking illegal substance daily. Is a danger to himself and others." Patient describes having premonitions . He  reports " I envisioned these things happening 40 years ago". States for example that 30 + years ago he had a dream about a sexual encounter which occurred " in an identical way of the way I saw it". States " I have a lot of gifts, because I am ridiculously  intelligent. My mind runs very fast , it catalogs everything". States " I can do all your jobs simultaneously". He  reports being irritated and angry about being brought to hospital and states " I am intending to sue some people", but denies any violent or homicidal ideations.  He denies any suicidal ideations, and states " I love my life ". Denies prior psychiatric admissions . Denies history of psychiatric illness . He reports past history of being treated with Celexa in the past in the context of anxiety related to marital discord. States he did not tolerate it well so stopped after several weeks. He currently does not endorse prior history of Bipolar Spectrum Disorder or diagnosis. Reports occasional use of " Rave" drugs. States he has used " mushrooms",MDMA- last drug use was 2-3 weeks ago. Denies alcohol abuse, states he drinks up to a gallon of liquor with friends about once a week. Admission BAL <10.  Denies medical illnesses . History of R ankle fractures related to fall several years ago. .Allergic to Sulfa Antibiotics . Was not taking any medications prior to admission.  Labs reviewed- AST and ALT elevated .   Currently patient is not presenting with symptoms of WDL- BP is 110/85, pulse 98. No tremors , no diaphoresis.   Dx- Bipolar Disorder, Manic  Plan- Inpatient admission. He has been started on Zyprexa 10 mgrs QHS and on Lithium 300 mgrs BID ( creatinine 0.96)  LFTs noted to be elevated ( AST 137, ALT 187) . Will order hepatitis panel . As above, patient reports intermittent binge drinking. Admission BAL negative. Ativan PRN for alcohol WDL if needed, as per CIWA protocol Recheck LFTs in AM      Musculoskeletal: Strength & Muscle Tone: within normal  limits Gait & Station: normal Patient leans: N/A  Psychiatric Specialty Exam: Physical Exam  Review of Systems no headache, no chest pain, no shortness of breath, no vomiting , no fever  Blood pressure 110/85, pulse 98, temperature 97.8 F (36.6 C), temperature source Oral, resp. rate 18, height 5\' 9"  (1.753 m), weight 77.1 kg, SpO2 99  %.Body mass index is 25.1 kg/m.  General Appearance: Fairly Groomed  Eye Contact:  Good  Speech:  Pressured  Volume:  Normal  Mood:  " My mood is wonderful"  Affect:  vaguely irritable   Thought Process:  Linear and Descriptions of Associations: Tangential- becomes tangential with open ended questions  Orientation:  Full (Time, Place, and Person)  Thought Content:  no hallucinations, no delusions  Suicidal Thoughts:  No denies suicidal or self injurious ideations, denies homicidal or violent ideations  Homicidal Thoughts:  No  Memory:  recent and remote grossly intact   Judgement:  Fair  Insight:  Lacking  Psychomotor Activity:  Normal- no psychomotor agitation at this time  Concentration:  Concentration: Fair and Attention Span: Fair  Recall:  Good  Fund of Knowledge:  Good  Language:  Good  Akathisia:  Negative  Handed:  Right  AIMS (if indicated):     Assets:  Desire for Improvement Resilience  ADL's:  Intact  Cognition:  WNL  Sleep:  Number of Hours: 6.5      COGNITIVE FEATURES THAT CONTRIBUTE TO RISK:  Closed-mindedness, Loss of executive function and Polarized thinking    SUICIDE RISK:   Mild:  Suicidal ideation of limited frequency, intensity, duration, and specificity.  There are no identifiable plans, no associated intent, mild dysphoria and related symptoms, good self-control (both objective and subjective assessment), few other risk factors, and identifiable protective factors, including available and accessible social support.  PLAN OF CARE: Patient will be admitted to inpatient psychiatric unit for stabilization and safety. Will provide and encourage milieu participation. Provide medication management and maked adjustments as needed.  Will follow daily.    I certify that inpatient services furnished can reasonably be expected to improve the patient's condition.   Jenne Campus, MD 12/18/2019, 12:34 PM

## 2019-12-18 NOTE — Plan of Care (Signed)
Progress note  D: Pt found in the hallway; compliant with medication administration. Pt denies any physical symptoms or pain. Pt states they slept well and has a request to move their morning dose of zyprexa to bedtime. Pt is still hyperactive and fidgety and can be intrusive at times. Pt is pleasant though. Pt denies si/hi/ah/vh and verbally agrees to approach staff if these become apparent or before harming themself/others while at bhh.  A: Pt provided support and encouragement. Pt given medication per protocol and standing orders. Q43m safety checks implemented and continued.  R: Pt safe on the unit. Will continue to monitor.   Pt progressing in the following metrics  Problem: Education: Goal: Utilization of techniques to improve thought processes will improve Outcome: Progressing Goal: Knowledge of the prescribed therapeutic regimen will improve Outcome: Progressing   Problem: Activity: Goal: Interest or engagement in leisure activities will improve Outcome: Progressing Goal: Imbalance in normal sleep/wake cycle will improve Outcome: Progressing

## 2019-12-19 LAB — HEPATIC FUNCTION PANEL
ALT: 187 U/L — ABNORMAL HIGH (ref 0–44)
AST: 110 U/L — ABNORMAL HIGH (ref 15–41)
Albumin: 4 g/dL (ref 3.5–5.0)
Alkaline Phosphatase: 86 U/L (ref 38–126)
Bilirubin, Direct: 0.1 mg/dL (ref 0.0–0.2)
Indirect Bilirubin: 0.5 mg/dL (ref 0.3–0.9)
Total Bilirubin: 0.6 mg/dL (ref 0.3–1.2)
Total Protein: 7.9 g/dL (ref 6.5–8.1)

## 2019-12-19 LAB — HEPATITIS PANEL, ACUTE
HCV Ab: REACTIVE — AB
Hep A IgM: NONREACTIVE
Hep B C IgM: NONREACTIVE
Hepatitis B Surface Ag: NONREACTIVE

## 2019-12-19 MED ORDER — LITHIUM CARBONATE ER 450 MG PO TBCR
450.0000 mg | EXTENDED_RELEASE_TABLET | Freq: Every day | ORAL | Status: DC
  Administered 2019-12-20 – 2019-12-21 (×2): 450 mg via ORAL
  Filled 2019-12-19 (×4): qty 1

## 2019-12-19 MED ORDER — OLANZAPINE 5 MG PO TBDP
5.0000 mg | ORAL_TABLET | Freq: Every day | ORAL | Status: DC
  Administered 2019-12-19: 5 mg via ORAL
  Filled 2019-12-19 (×3): qty 1

## 2019-12-19 NOTE — [facility] Group Notes (Signed)
  [facility] LCSW Group Therapy Note  Late entry on 12/20/19 for 12/19/19.  Date/Time:  12/19/2019 11:15AM-12:00PM  Type of Therapy and Topic:  Group Therapy:  Feelings About Hospitalization  Participation Level:  Active   Description of Group This process group involved patients discussing their feelings related to being hospitalized, as well as the benefits they see to being in the hospital.  These feelings and benefits were itemized.  The group then brainstormed specific ways in which they could seek those same benefits when they discharge and return home.  Therapeutic Goals Patient will identify and describe positive and negative feelings related to hospitalization Patient will verbalize benefits of hospitalization to themselves personally Patients will brainstorm together ways they can obtain similar benefits in the outpatient setting, identify barriers to wellness and possible solutions  Summary of Patient Progress:  The patient engaged in introductory check-in, sharing he feels "irritated", further elaborating on feelings surrounding intake and observation process. Pt expressed complaints to CSW regarding feeling unheard during admissions surrounding medication discussions. Pt expressed his primary feelings about being hospitalized are "pissed off", further detailing of being tired of talking to people that won't "fucking listen". Pt proved able to be redirected in discussion to actively identify and process benefits he has been able to receive/obtain from being hospitalized. Pt proved able to share positive experience in finding medication that will assist in sleep difficulties. Pt further detailed negative feelings surrounding the admissions process, observation unit, and IVC process. Pt proved understanding of alternate group members input and feedback from CSW.  Therapeutic Modalities Cognitive Behavioral Therapy Motivational Interviewing    Micheline Maze 12/20/19 8:59AM

## 2019-12-19 NOTE — Progress Notes (Signed)
Richard Krueger  12/19/2019 2:59 PM Richard Krueger  MRN:  [id number]   Subjective: Follow-up for this 54 year old male diagnosed with bipolar 1 disorder.  Patient reports this morning that he is not very happy with that how he feels this morning.  He states he feels like he has a cloud over his head and feels groggy.  Patient reports he does not like taking the lithium in the evening because he felt like it sped his mind up.  Patient is in agreement with taking a decreased dose of Zyprexa and continue lithium as long as it is only given to him in the mornings.  He continues to deny any suicidal homicidal ideations and denies any hallucinations.  He reports that he feels like he is rated the leave and does not want to be stuck in the hospital for 2 weeks just to have his medications arranged.  He appears a little irritable today but wants the medications are discussed he is more calming and easier to talk to.  Principal Problem: Bipolar I disorder, most recent episode (or current) manic (HCC) Diagnosis: Principal Problem:   Bipolar I disorder, most recent episode (or current) manic (HCC)  Total Time spent with patient: 30 minutes  Past Psychiatric History: Denies any psychiatric hospitalizations.  Only reports Celexa years ago but did not continue it.  Reports 1 previous psychiatric appointment when he was started on Celexa.  Denies any suicide attempts.  Denies any previous diagnoses.  Does report chronic history of alcohol use as well as various substances from time to time.  Past Medical History: History reviewed. No pertinent past medical history.  Past Surgical History:  Procedure Laterality Date  . ankle repair    . ORIF DISTAL FEMUR FRACTURE     Family History: History reviewed. No pertinent family history. Family Psychiatric  History: None reported Social History:  Social History   Substance and Sexual Activity  Alcohol Use Yes  . Alcohol/week: 1.0 - 2.0 standard drinks   . Types: 1 - 2 Shots of liquor per week     Social History   Substance and Sexual Activity  Drug Use No    Social History   Socioeconomic History  . Marital status: Divorced    Spouse name: Not on file  . Number of children: Not on file  . Years of education: Not on file  . Highest education level: Not on file  Occupational History  . Not on file  Tobacco Use  . Smoking status: Current Every Day Smoker    Packs/day: 1.00    Years: 38.00    Pack years: 38.00  . Smokeless tobacco: Never Used  Substance and Sexual Activity  . Alcohol use: Yes    Alcohol/week: 1.0 - 2.0 standard drinks    Types: 1 - 2 Shots of liquor per week  . Drug use: No  . Sexual activity: Not on file  Other Topics Concern  . Not on file  Social History Narrative  . Not on file   Social Determinants of Health   Financial Resource Strain:   . Difficulty of Paying Living Expenses:   Food Insecurity:   . Worried About Programme researcher, broadcasting/film/video in the Last Year:   . Barista in the Last Year:   Transportation Needs:   . Freight forwarder (Medical):   Marland Kitchen Lack of Transportation (Non-Medical):   Physical Activity:   . Days of Exercise per Week:   . Minutes  of Exercise per Session:   Stress:   . Feeling of Stress :   Social Connections:   . Frequency of Communication with Friends and Family:   . Frequency of Social Gatherings with Friends and Family:   . Attends Religious Services:   . Active Member of Clubs or Organizations:   . Attends Banker Meetings:   Marland Kitchen Marital Status:    Additional Social History:    Pain Medications: see MAR Prescriptions: see MAR Over the Counter: see MAR History of alcohol / drug use?: No history of alcohol / drug abuse                    Sleep: Good  Appetite:  Good  Current Medications: Current Facility-Administered Medications  Medication Dose Route Frequency Provider Last Rate Last Admin  . acetaminophen (TYLENOL) tablet 650 mg   650 mg Oral Q6H PRN Anike, Adaku C, NP      . alum & mag hydroxide-simeth (MAALOX/MYLANTA) 200-200-20 MG/5ML suspension 30 mL  30 mL Oral Q4H PRN Anike, Adaku C, NP      . [START ON 12/20/2019] lithium carbonate (ESKALITH) CR tablet 450 mg  450 mg Oral Daily Lupe Bonner B, FNP      . magnesium hydroxide (MILK OF MAGNESIA) suspension 30 mL  30 mL Oral Daily PRN Anike, Adaku C, NP      . OLANZapine zydis (ZYPREXA) disintegrating tablet 10 mg  10 mg Oral Q8H PRN Starkes-Perry, Juel Burrow, FNP       And  . ziprasidone (GEODON) injection 20 mg  20 mg Intramuscular PRN Starkes-Perry, Juel Burrow, FNP      . OLANZapine zydis (ZYPREXA) disintegrating tablet 5 mg  5 mg Oral QHS Caoimhe Damron, Gerlene Burdock, FNP        Lab Results:  Results for orders placed or performed during the hospital encounter of 12/15/19 (from the past 48 hour(s))  Hepatic function panel     Status: Abnormal   Collection Time: 12/19/19  6:30 AM  Result Value Ref Range   Total Protein 7.9 6.5 - 8.1 g/dL   Albumin 4.0 3.5 - 5.0 g/dL   AST 846 (H) 15 - 41 U/L   ALT 187 (H) 0 - 44 U/L   Alkaline Phosphatase 86 38 - 126 U/L   Total Bilirubin 0.6 0.3 - 1.2 mg/dL   Bilirubin, Direct 0.1 0.0 - 0.2 mg/dL   Indirect Bilirubin 0.5 0.3 - 0.9 mg/dL    Comment: Performed at [facility], [address], [place], [place] [address]    Blood Alcohol level:  Lab Results  Component Value Date   ETH <10 12/16/2019    Metabolic Disorder Labs: Lab Results  Component Value Date   HGBA1C 5.5 12/16/2019   MPG 111.15 12/16/2019   No results found for: PROLACTIN Lab Results  Component Value Date   CHOL 195 12/16/2019   TRIG 47 12/16/2019   HDL 71 12/16/2019   CHOLHDL 2.7 12/16/2019   VLDL 9 12/16/2019   LDLCALC 115 (H) 12/16/2019    Physical Findings: AIMS: Facial and Oral Movements Muscles of Facial Expression: None, normal Lips and Perioral Area: None, normal Jaw: None, normal Tongue: None, normal,Extremity  Movements Upper (arms, wrists, hands, fingers): None, normal Lower (legs, knees, ankles, toes): None, normal, Trunk Movements Neck, shoulders, hips: None, normal, Overall Severity Severity of abnormal movements (highest score from questions above): None, normal Incapacitation due to abnormal movements: None, normal Patient's awareness of abnormal  movements (rate only patient's report): No Awareness, Dental Status Current problems with teeth and/or dentures?: No Does patient usually wear dentures?: No  CIWA:  CIWA-Ar Total: 4 COWS:  COWS Total Score: 2  Musculoskeletal: Strength & Muscle Tone: within normal limits Gait & Station: normal Patient leans: N/A  Psychiatric Specialty Exam: Physical Exam  Nursing Krueger and vitals reviewed. Constitutional: He is oriented to person, place, and time. He appears well-developed and well-nourished.  Cardiovascular: Normal rate.  Respiratory: Effort normal.  Musculoskeletal:        General: Normal range of motion.  Neurological: He is alert and oriented to person, place, and time.  Skin: Skin is warm.  Psychiatric: His speech is rapid and/or pressured. He is hyperactive.    Review of Systems  Constitutional: Negative.   HENT: Negative.   Eyes: Negative.   Respiratory: Negative.   Cardiovascular: Negative.   Gastrointestinal: Negative.   Genitourinary: Negative.   Musculoskeletal: Negative.   Skin: Negative.   Neurological: Negative.   Psychiatric/Behavioral: Negative.     Blood pressure 118/87, pulse 79, temperature 97.7 F (36.5 C), temperature source Oral, resp. rate 18, height 5\' 9"  (1.753 m), weight 77.1 kg, SpO2 99 %.Body mass index is 25.1 kg/m.  General Appearance: Casual  Eye Contact:  Good  Speech:  Clear and Coherent and Normal Rate  Volume:  Increased  Mood:  Irritable  Affect:  Congruent  Thought Process:  Goal Directed and Descriptions of Associations: Intact  Orientation:  Full (Time, Place, and Person)  Thought  Content:  WDL and Tangential  Suicidal Thoughts:  No  Homicidal Thoughts:  No  Memory:  Immediate;   Fair Recent;   Fair Remote;   Fair  Judgement:  Impaired  Insight:  Fair  Psychomotor Activity:  Normal  Concentration:  Concentration: Fair  Recall:  [employer] of Knowledge:  Fair  Language:  Fair  Akathisia:  No  Handed:  Right  AIMS (if indicated):     Assets:  Desire for Improvement Financial Resources/Insurance Housing Social Support Transportation  ADL's:  Intact  Cognition:  WNL  Sleep:  Number of Hours: 6.5   Assessment: Patient presents in Is awake but lying down.  Patient appears to be irritable today but is compliant with medications and treatment.  He has been attending groups.  He was a little upset about his medications stating that they are making him feel like he has a cloud over his head.  After discussing his medications with him and consulting with Dr. Mallie Darting will decrease his nighttime Zyprexa dose back to 5 mg and will change his lithium to once a day 450 mg.  Patient has continued to be very talkative but his speech seems to be slowing some.  He has appeared to be hyperactive still but this also seems to be improving minimal.  He has not made any comments to me today about thinking that he is God but have heard him on the phone speaking with people about sending his Bible to him.  Treatment Plan Summary: Daily contact with patient to assess and evaluate symptoms and progress in treatment and Medication management Changes Lithobid to Eskalith 450 mg p.o. daily for mood stability Decrease Zyprexa Zydis to 5 mg p.o. nightly for mood stability and bipolar 1 Continue agitation protocol of Zyprexa Zydis and Geodon Encourage group therapy participation Continue every 15 minute safety checks  Lewis Shock, FNP 12/19/2019, 2:59 PM

## 2019-12-19 NOTE — Progress Notes (Addendum)
Pt denied SI/HI/AVH. Pt Presented as irritable this morning because he does not want to take Zyprexa at night. Said the Zyprexa made him feel "clowdy."  Pt said he feels he only needs to take Lithium.  This Clinical research associate notified NP re: what pt disclosed about medications.  After pt spoke to NP, pt has been calm, pleasant and interacting with peers and staff in the milieu.  RN will continue to monitor and provide assistance as needed.

## 2019-12-20 MED ORDER — OLANZAPINE 7.5 MG PO TABS
7.5000 mg | ORAL_TABLET | Freq: Every day | ORAL | Status: DC
  Administered 2019-12-20: 7.5 mg via ORAL
  Filled 2019-12-20 (×3): qty 1

## 2019-12-20 NOTE — Progress Notes (Signed)
   12/19/19 2120  Psych Admission Type (Psych Patients Only)  Admission Status Involuntary  Psychosocial Assessment  Patient Complaints None  Eye Contact Fair  Facial Expression Animated  Affect Appropriate to circumstance;Flat  Speech Logical/coherent;Pressured  Interaction Assertive  Motor Activity Fidgety;Restless  Appearance/Hygiene Unremarkable  Behavior Characteristics Appropriate to situation;Calm  Mood Pleasant  Thought Process  Coherency WDL  Content WDL  Delusions None reported or observed  Perception WDL  Hallucination None reported or observed  Judgment Poor  Confusion None  Danger to Self  Current suicidal ideation? Denies  Danger to Others  Danger to Others None reported or observed

## 2019-12-20 NOTE — Progress Notes (Signed)
[facility] MD Progress Note  12/20/2019 12:55 PM Richard Krueger  MRN:  [id number] Subjective: Patient is a 54 year old male with a probable past psychiatric history significant for bipolar disorder who was admitted on 12/16/2019 secondary to mania and unstable emotional drives.  Objective: Patient is seen and examined.  Patient is a 54 year old male with a probable past psychiatric history significant for bipolar disorder who presented as a walk-in under involuntary commitment on 12/18/2019 after being petitioned by his ex-wife.  At that time he was hyper religious, angry, agitated, irritable.  Initially he was on Zyprexa and when he got to the inpatient unit this was increased to 5 mg p.o. daily and 10 mg p.o. q. at bedtime.  Lithium carbonate was also started 300 mg p.o. twice daily.  On 5/15 he was continue to be unhappy.  He stated that he felt like he had a cloud over his head, and was overmedicated.  His Zyprexa was decreased to 5 mg p.o. nightly, and his lithium carbonate was changed to 450 mg p.o. daily.  This a.m. he stated he feels better.  He still is tangential, pressured, and still irritable but certainly not at the degree as described in the notes earlier.  We discussed his medications and he is agreeable to increasing the Zyprexa at bedtime.  His vital signs are stable, he is afebrile.  He slept 5.25 hours last night.  Review of his admission laboratories revealed significantly elevated AST and ALT.  His AST was 110, his ALT was 187.  The only other labs that we have available are from 4 days ago and they were higher at that point.  His AST was 139 and his ALT was 186.  TSH was 1.478.  Drug screen was not obtained.  His hepatitis A was nonreactive, hepatitis B was nonreactive, and hepatitis C was positive.  Principal Problem: Bipolar I disorder, most recent episode (or current) manic (HCC) Diagnosis: Principal Problem:   Bipolar I disorder, most recent episode (or current) manic (HCC)  Total  Time spent with patient: 20 minutes  Past Psychiatric History: See admission H&P  Past Medical History: History reviewed. No pertinent past medical history.  Past Surgical History:  Procedure Laterality Date  . ankle repair    . ORIF DISTAL FEMUR FRACTURE     Family History: History reviewed. No pertinent family history. Family Psychiatric  History: See admission H&P Social History:  Social History   Substance and Sexual Activity  Alcohol Use Yes  . Alcohol/week: 1.0 - 2.0 standard drinks  . Types: 1 - 2 Shots of liquor per week     Social History   Substance and Sexual Activity  Drug Use No    Social History   Socioeconomic History  . Marital status: Divorced    Spouse name: Not on file  . Number of children: Not on file  . Years of education: Not on file  . Highest education level: Not on file  Occupational History  . Not on file  Tobacco Use  . Smoking status: Current Every Day Smoker    Packs/day: 1.00    Years: 38.00    Pack years: 38.00  . Smokeless tobacco: Never Used  Substance and Sexual Activity  . Alcohol use: Yes    Alcohol/week: 1.0 - 2.0 standard drinks    Types: 1 - 2 Shots of liquor per week  . Drug use: No  . Sexual activity: Not on file  Other Topics Concern  . Not on file  Social History Narrative  . Not on file   Social Determinants of Health   Financial Resource Strain:   . Difficulty of Paying Living Expenses:   Food Insecurity:   . Worried About Programme researcher, broadcasting/film/video in the Last Year:   . Barista in the Last Year:   Transportation Needs:   . Freight forwarder (Medical):   Marland Kitchen Lack of Transportation (Non-Medical):   Physical Activity:   . Days of Exercise per Week:   . Minutes of Exercise per Session:   Stress:   . Feeling of Stress :   Social Connections:   . Frequency of Communication with Friends and Family:   . Frequency of Social Gatherings with Friends and Family:   . Attends Religious Services:   . Active  Member of Clubs or Organizations:   . Attends Banker Meetings:   Marland Kitchen Marital Status:    Additional Social History:    Pain Medications: see MAR Prescriptions: see MAR Over the Counter: see MAR History of alcohol / drug use?: No history of alcohol / drug abuse                    Sleep: Fair  Appetite:  Fair  Current Medications: Current Facility-Administered Medications  Medication Dose Route Frequency Provider Last Rate Last Admin  . acetaminophen (TYLENOL) tablet 650 mg  650 mg Oral Q6H PRN Anike, Adaku C, NP      . alum & mag hydroxide-simeth (MAALOX/MYLANTA) 200-200-20 MG/5ML suspension 30 mL  30 mL Oral Q4H PRN Anike, Adaku C, NP      . lithium carbonate (ESKALITH) CR tablet 450 mg  450 mg Oral Daily Money, Gerlene Burdock, FNP   450 mg at 12/20/19 0752  . magnesium hydroxide (MILK OF MAGNESIA) suspension 30 mL  30 mL Oral Daily PRN Anike, Adaku C, NP      . OLANZapine (ZYPREXA) tablet 7.5 mg  7.5 mg Oral QHS Jola Babinski, Marlane Mingle, MD      . OLANZapine zydis (ZYPREXA) disintegrating tablet 10 mg  10 mg Oral Q8H PRN Starkes-Perry, Juel Burrow, FNP       And  . ziprasidone (GEODON) injection 20 mg  20 mg Intramuscular PRN Maryagnes Amos, FNP        Lab Results:  Results for orders placed or performed during the hospital encounter of 12/15/19 (from the past 48 hour(s))  Hepatitis panel, acute     Status: Abnormal   Collection Time: 12/18/19  6:09 PM  Result Value Ref Range   Hepatitis B Surface Ag NON REACTIVE NON REACTIVE   HCV Ab Reactive (A) NON REACTIVE    Comment: (NOTE) The CDC recommends that a Reactive HCV antibody result be followed up  with a HCV Nucleic Acid Amplification test.    Hep A IgM NON REACTIVE NON REACTIVE   Hep B C IgM NON REACTIVE NON REACTIVE    Comment: Performed at [facility], [address] [place], [place] [address]  Hepatic function panel     Status: Abnormal   Collection Time: 12/19/19  6:30 AM  Result Value Ref Range    Total Protein 7.9 6.5 - 8.1 g/dL   Albumin 4.0 3.5 - 5.0 g/dL   AST 034 (H) 15 - 41 U/L   ALT 187 (H) 0 - 44 U/L   Alkaline Phosphatase 86 38 - 126 U/L   Total Bilirubin 0.6 0.3 - 1.2 mg/dL   Bilirubin,  Direct 0.1 0.0 - 0.2 mg/dL   Indirect Bilirubin 0.5 0.3 - 0.9 mg/dL    Comment: Performed at [facility], [address], [place], [place] [address]    Blood Alcohol level:  Lab Results  Component Value Date   ETH <10 12/16/2019    Metabolic Disorder Labs: Lab Results  Component Value Date   HGBA1C 5.5 12/16/2019   MPG 111.15 12/16/2019   No results found for: PROLACTIN Lab Results  Component Value Date   CHOL 195 12/16/2019   TRIG 47 12/16/2019   HDL 71 12/16/2019   CHOLHDL 2.7 12/16/2019   VLDL 9 12/16/2019   LDLCALC 115 (H) 12/16/2019    Physical Findings: AIMS: Facial and Oral Movements Muscles of Facial Expression: None, normal Lips and Perioral Area: None, normal Jaw: None, normal Tongue: None, normal,Extremity Movements Upper (arms, wrists, hands, fingers): None, normal Lower (legs, knees, ankles, toes): None, normal, Trunk Movements Neck, shoulders, hips: None, normal, Overall Severity Severity of abnormal movements (highest score from questions above): None, normal Incapacitation due to abnormal movements: None, normal Patient's awareness of abnormal movements (rate only patient's report): No Awareness, Dental Status Current problems with teeth and/or dentures?: No Does patient usually wear dentures?: No  CIWA:  CIWA-Ar Total: 4 COWS:  COWS Total Score: 2  Musculoskeletal: Strength & Muscle Tone: within normal limits Gait & Station: normal Patient leans: N/A  Psychiatric Specialty Exam: Physical Exam  Nursing note and vitals reviewed. Constitutional: He is oriented to person, place, and time. He appears well-developed and well-nourished.  HENT:  Head: Normocephalic and atraumatic.  Respiratory: Effort normal.  Neurological:  He is alert and oriented to person, place, and time.    Review of Systems  Blood pressure 130/84, pulse 86, temperature 98 F (36.7 C), temperature source Oral, resp. rate 18, height 5\' 9"  (1.753 m), weight 77.1 kg, SpO2 100 %.Body mass index is 25.1 kg/m.  General Appearance: Casual  Eye Contact:  Good  Speech:  Pressured  Volume:  Increased  Mood:  Dysphoric and Irritable  Affect:  Labile  Thought Process:  Goal Directed and Descriptions of Associations: Tangential  Orientation:  Full (Time, Place, and Person)  Thought Content:  Tangential  Suicidal Thoughts:  No  Homicidal Thoughts:  No  Memory:  Immediate;   Fair Recent;   Fair Remote;   Fair  Judgement:  Intact  Insight:  Fair  Psychomotor Activity:  Increased  Concentration:  Concentration: Fair and Attention Span: Fair  Recall:  of Knowledge:  Fair  Language:  Good  Akathisia:  Negative  Handed:  Right  AIMS (if indicated):     Assets:  Desire for Improvement Resilience  ADL's:  Intact  Cognition:  WNL  Sleep:  Number of Hours: 5.25     Treatment Plan Summary: Daily contact with patient to assess and evaluate symptoms and progress in treatment, Medication management and Plan Patient is seen and examined.  Patient is a 54 year old male with the above-stated past psychiatric history who is seen in follow-up.   Diagnosis: #1 bipolar disorder, most recently manic, severe, #2 hepatitis C  Patient is seen in follow-up.  He remains manic, but at least according to the old notes he is improved.  He likes the lithium during the day and Zyprexa at night.  He has agreed to increase the Zyprexa to 7.5 mg p.o. nightly.  We will do that tonight.  No change in the rest of his medications, hopefully he  will continue to improve with his current treatment.  I will go on and order a lithium level, TSH and metabolic panel in a.m. tomorrow in case he is ready for discharge.  1.  Continue lithium carbonate CR 450 mg p.o.  daily for mania. 2.  Increase olanzapine to 7.5 mg p.o. nightly for mania. 3.  Order TSH, lithium level and metabolic panel in a.m. tomorrow. 4.  Disposition planning-in progress.  Sharma Covert, MD 12/20/2019, 12:55 PM

## 2019-12-20 NOTE — Progress Notes (Signed)
   12/19/19 2120  COVID-19 Daily Checkoff  Have you had a fever (temp > 37.80C/100F)  in the past 24 hours?  No  If you have had runny nose, nasal congestion, sneezing in the past 24 hours, has it worsened? No  COVID-19 EXPOSURE  Have you traveled outside the state in the past 14 days? No  Have you been in contact with someone with a confirmed diagnosis of COVID-19 or PUI in the past 14 days without wearing appropriate PPE? No  Have you been living in the same home as a person with confirmed diagnosis of COVID-19 or a PUI (household contact)? No  Have you been diagnosed with COVID-19? No

## 2019-12-20 NOTE — Progress Notes (Signed)
DAR NOTE: Patient presents with anxious affect and mood.  Denies suicidal thoughts, pain, auditory and visual hallucinations.  Rates depression at 0, hopelessness at 0, and anxiety at 0.  Maintained on routine safety checks.  Medications given as prescribed.  Support and encouragement offered as needed.  Attended group and participated.  States goal for today is "get out of here."  Patient observed socializing with peers in the dayroom.  Observed going into other people's room but redirected and reminded of unit rules/protocols.  Offered no complaint.

## 2019-12-20 NOTE — BHH Group Notes (Signed)
[facility] LCSW Group Therapy Note  Date/Time:  12/20/2019 11:15-12:00PM  Type of Therapy and Topic:  Group Therapy:  Healthy and Unhealthy Supports  Participation Level:  Active   Description of Group:  Patients in this group were introduced to the idea of adding a variety of healthy supports to address the various needs in their lives.Patients discussed what additional healthy supports could be helpful in their recovery and wellness after discharge in order to prevent future hospitalizations.   An emphasis was placed on using counselor, doctor, therapy groups, 12-step groups, and problem-specific support groups to expand supports.  They also worked as a group on developing a specific plan for several patients to deal with unhealthy supports through boundary-setting, psychoeducation with loved ones, and even termination of relationships.   Therapeutic Goals:   1)  discuss importance of adding supports to stay well once out of the hospital  2)  compare healthy versus unhealthy supports and identify some examples of each  3)  generate ideas and descriptions of healthy supports that can be added  4)  offer mutual support about how to address unhealthy supports  5)  encourage active participation in and adherence to discharge plan    Summary of Patient Progress:  The patient actively engaged in introductory check-in, sharing of feeling "8/10, today, I'm not at a 10 because I'm still here". Pt further discussed the benefits he has begun to receive/identify from being hospitalized. Pt stated that current healthy supports in his life are "My 3 inner circle of friends, 40-50 close friends, and tons more friends I could depend on for anything." Pt actively identified current unhealthy supports to include "my baby mother, and one of my so-called friends who both worked it out how to put me here".  The patient expressed a willingness to strengthen relationships with close friends in efforts to  increase support to help in his recovery journey. Pt proved receptive to alternate group members feedback. Pt proved to become noticeably irritated by alternate group member that continued to attempt to sabotage group. Pt proved receptive to CSW feedback and redirection of group.   Therapeutic Modalities:   Motivational Interviewing Brief Solution-Focused Therapy  Micheline Maze 12/20/2019  3:29 PM

## 2019-12-21 LAB — COMPREHENSIVE METABOLIC PANEL
ALT: 167 U/L — ABNORMAL HIGH (ref 0–44)
AST: 87 U/L — ABNORMAL HIGH (ref 15–41)
Albumin: 4 g/dL (ref 3.5–5.0)
Alkaline Phosphatase: 85 U/L (ref 38–126)
Anion gap: 10 (ref 5–15)
BUN: 18 mg/dL (ref 6–20)
CO2: 25 mmol/L (ref 22–32)
Calcium: 9.2 mg/dL (ref 8.9–10.3)
Chloride: 103 mmol/L (ref 98–111)
Creatinine, Ser: 0.85 mg/dL (ref 0.61–1.24)
GFR calc Af Amer: >60 mL/min (ref >60)
GFR calc non Af Amer: >60 mL/min (ref >60)
Glucose, Bld: 121 mg/dL — ABNORMAL HIGH (ref 70–99)
Potassium: 4 mmol/L (ref 3.5–5.1)
Sodium: 138 mmol/L (ref 135–145)
Total Bilirubin: 0.6 mg/dL (ref 0.3–1.2)
Total Protein: 7.9 g/dL (ref 6.5–8.1)

## 2019-12-21 LAB — LITHIUM LEVEL: Lithium Lvl: <0.06 mmol/L — ABNORMAL LOW (ref 0.60–1.20)

## 2019-12-21 LAB — TSH: TSH: 0.833 u[IU]/mL (ref 0.350–4.500)

## 2019-12-21 MED ORDER — LITHIUM CARBONATE ER 300 MG PO TBCR: 300.0000 mg | EXTENDED_RELEASE_TABLET | Freq: Two times a day (BID) | ORAL | 0 refills | Status: AC

## 2019-12-21 MED ORDER — LITHIUM CARBONATE ER 450 MG PO TBCR
450.0000 mg | EXTENDED_RELEASE_TABLET | Freq: Every day | ORAL | Status: DC
  Filled 2019-12-21: qty 1

## 2019-12-21 MED ORDER — OLANZAPINE 7.5 MG PO TABS: 7.5000 mg | ORAL_TABLET | Freq: Every day | ORAL | 0 refills | Status: DC

## 2019-12-21 MED ORDER — LITHIUM CARBONATE ER 450 MG PO TBCR: 450.0000 mg | EXTENDED_RELEASE_TABLET | Freq: Every day | ORAL | 0 refills | Status: DC

## 2019-12-21 MED ORDER — OLANZAPINE 10 MG PO TABS: 10.0000 mg | ORAL_TABLET | Freq: Every day | ORAL | 0 refills | Status: AC

## 2019-12-21 MED ORDER — LITHIUM CARBONATE ER 300 MG PO TBCR: 300.0000 mg | EXTENDED_RELEASE_TABLET | Freq: Two times a day (BID) | ORAL | Status: DC

## 2019-12-21 MED ORDER — OLANZAPINE 10 MG PO TABS
10.0000 mg | ORAL_TABLET | Freq: Every day | ORAL | Status: DC
  Filled 2019-12-21: qty 1

## 2019-12-21 NOTE — Progress Notes (Signed)
Recreation Therapy Notes  Date: 5.17.21 Time: 1000 Location: 500 Hall Dayroom  Group Topic: Coping Skills  Goal Area(s) Addresses:  Patients will identify positive coping strategies. Patients will identify benefit of using positive coping strategies over negative coping strategies.  Behavioral Response: None  Intervention: Worksheet, pencils  Activity: Healthy vs. Unhealthy Coping Strategies.  Patients were to identify a current problem they are facing, identify the unhealthy coping strategies they have used to address this problem and consequences of those coping strategies.  Patients then identified healthy coping strategies, benefits and barriers to using these strategies.  Education: Pharmacologist, Building control surveyor.   Education Outcome: Acknowledges understanding/In group clarification offered/Needs additional education.   Clinical Observations/Feedback: Pt did not participate.  Pt was finishing paperwork for discharge.     Caroll Rancher, LRT/CTRS     Caroll Rancher A 12/21/2019 11:43 AM

## 2019-12-21 NOTE — Progress Notes (Signed)
  [facility] Adult Case Management Discharge Plan :  Will you be returning to the same living situation after discharge:  Yes,  to residence. At discharge, do you have transportation home?: No. Safe Transport has been arranged. Do you have the ability to pay for your medications: Yes,  has insurance.   Release of information consent forms completed and in the chart;  Patient's signature needed at discharge.  Patient to Follow up at: Follow-up Information    [facility] Follow up on 01/26/2020.   Specialty: Behavioral Health Why: You are scheduled for an appointment for medication management on 01/26/20 at 1:00 pm.  This will be a Virtual appointment. Contact information: [address] [place] [address] [phone]       Vesta Mixer. Call.   Why: A referral for therapy services has been made on your behalf.  Please call this provider to obtain your appointment information.  Contact information: [address] [place] [phone]           Next level of care provider has access to [facility] Link:yes  Safety Planning and Suicide Prevention discussed: Yes,  with patient and friend.      Has patient been referred to the Quitline?: N/A patient is not a smoker  Patient has been referred for addiction treatment: Pt. refused referral  Otelia Santee, LCSW 12/21/2019, 12:05 PM

## 2019-12-21 NOTE — Progress Notes (Signed)
   12/20/19 2100  Psych Admission Type (Psych Patients Only)  Admission Status Involuntary  Psychosocial Assessment  Eye Contact Fair  Facial Expression Animated  Affect Appropriate to circumstance;Flat  Speech Logical/coherent;Pressured  Interaction Assertive  Motor Activity Fidgety;Restless  Appearance/Hygiene Unremarkable  Behavior Characteristics Cooperative  Mood Anxious;Pleasant  Thought Process  Coherency WDL  Content WDL  Delusions None reported or observed  Perception WDL  Hallucination None reported or observed  Judgment Poor  Confusion None  Danger to Self  Current suicidal ideation? Denies  Danger to Others  Danger to Others None reported or observed

## 2019-12-21 NOTE — Plan of Care (Signed)
Pt was able to attend groups with limited impulsive behavior at completion of recreation therapy group sessions.   Caroll Rancher, LRT/CTRS

## 2019-12-21 NOTE — Discharge Summary (Addendum)
Physician Discharge Summary Note  Patient:  Richard Krueger is an 54 y.o., male MRN:  [id number] DOB:  01-11-1966 Patient phone:  [phone] (home)  Patient address:   [address] [place] [address],  Total Time spent with patient: 30 minutes  Date of Admission:  12/15/2019 Date of Discharge: 12/21/19  Reason for Admission:  54 year old male that presented as a walk-in under IVC and he was petitioned by his ex-wife.  Patient presented to be agitated, irritable, angry, and psychomotor agitation.  Principal Problem: Bipolar I disorder, most recent episode (or current) manic [facility]) Discharge Diagnoses: Principal Problem:   Bipolar I disorder, most recent episode (or current) manic (HCC)   Past Psychiatric History: Denies any psychiatric hospitalizations.  Only reports Celexa years ago but did not continue it.  Reports 1 previous psychiatric appointment when he was started on Celexa.  Denies any suicide attempts.  Denies any previous diagnoses.  Does report chronic history of alcohol use as well as various substances from time to time.  Past Medical History: History reviewed. No pertinent past medical history.  Past Surgical History:  Procedure Laterality Date   ankle repair     ORIF DISTAL FEMUR FRACTURE     Family History: History reviewed. No pertinent family history. Family Psychiatric  History: None reported Social History:  Social History   Substance and Sexual Activity  Alcohol Use Yes   Alcohol/week: 1.0 - 2.0 standard drinks   Types: 1 - 2 Shots of liquor per week     Social History   Substance and Sexual Activity  Drug Use No    Social History   Socioeconomic History   Marital status: Divorced    Spouse name: Not on file   Number of children: Not on file   Years of education: Not on file   Highest education level: Not on file  Occupational History   Not on file  Tobacco Use   Smoking status: Current Every Day Smoker    Packs/day: 1.00   Years: 38.00    Pack years: 38.00   Smokeless tobacco: Never Used  Substance and Sexual Activity   Alcohol use: Yes    Alcohol/week: 1.0 - 2.0 standard drinks    Types: 1 - 2 Shots of liquor per week   Drug use: No   Sexual activity: Not on file  Other Topics Concern   Not on file  Social History Narrative   Not on file   Social Determinants of Health   Financial Resource Strain:    Difficulty of Paying Living Expenses:   Food Insecurity:    Worried About Programme researcher, broadcasting/film/video in the Last Year:    Barista in the Last Year:   Transportation Needs:    Freight forwarder (Medical):    Lack of Transportation (Non-Medical):   Physical Activity:    Days of Exercise per Week:    Minutes of Exercise per Session:   Stress:    Feeling of Stress :   Social Connections:    Frequency of Communication with Friends and Family:    Frequency of Social Gatherings with Friends and Family:    Attends Religious Services:    Active Member of Clubs or Organizations:    Attends Banker Meetings:    Marital Status:     Hospital Course:  Patient remained on the [facility] unit for 5 days. The patient stabilized on medication and therapy. Patient was discharged on Lithium 300 mg Q12H  and Zyprexa 10 mg QHS. Patient has shown improvement with improved mood, affect, sleep, appetite, and interaction. Patient has attended group and participated. Patient has been seen in the day room interacting with peers and staff appropriately. Patient denies any SI/HI/AVH and contracts for safety. Patient agrees to follow up at [facility]. Patient is provided with prescriptions for their medications upon discharge.  Physical Findings: AIMS: Facial and Oral Movements Muscles of Facial Expression: None, normal Lips and Perioral Area: None, normal Jaw: None, normal Tongue: None, normal,Extremity Movements Upper (arms, wrists, hands, fingers): None, normal Lower  (legs, knees, ankles, toes): None, normal, Trunk Movements Neck, shoulders, hips: None, normal, Overall Severity Severity of abnormal movements (highest score from questions above): None, normal Incapacitation due to abnormal movements: None, normal Patient's awareness of abnormal movements (rate only patient's report): No Awareness, Dental Status Current problems with teeth and/or dentures?: No Does patient usually wear dentures?: No  CIWA:  CIWA-Ar Total: 4 COWS:  COWS Total Score: 2  Musculoskeletal: Strength & Muscle Tone: within normal limits Gait & Station: normal Patient leans: N/A  Psychiatric Specialty Exam: Physical Exam  Nursing note and vitals reviewed. Constitutional: He is oriented to person, place, and time. He appears well-developed and well-nourished.  Cardiovascular: Normal rate.  Respiratory: Effort normal.  Musculoskeletal:        General: Normal range of motion.  Neurological: He is alert and oriented to person, place, and time.  Skin: Skin is warm.    Review of Systems  Constitutional: Negative.   HENT: Negative.   Eyes: Negative.   Respiratory: Negative.   Cardiovascular: Negative.   Gastrointestinal: Negative.   Genitourinary: Negative.   Musculoskeletal: Negative.   Skin: Negative.   Neurological: Negative.   Psychiatric/Behavioral: Negative.     Blood pressure 134/90, pulse 80, temperature 97.8 F (36.6 C), temperature source Oral, resp. rate 18, height 5\' 9"  (1.753 m), weight 77.1 kg, SpO2 100 %.Body mass index is 25.1 kg/m.   General Appearance: Well Groomed  Engineer, water::  Good  Speech:  Normal Rate-less pressured  Volume:  Normal  Mood:  Reports his mood is "good".  Denies depression  Affect:  Less expansive, not irritable today.  Pleasant on approach  Thought Process:  Linear-thought process better organized/more linear  Orientation:  Other:  Fully alert and attentive  Thought Content:  No hallucinations, no delusions expressed does  not appear internally preoccupied  Suicidal Thoughts:  No denies suicidal or self-injurious ideations, no homicidal or violent ideations  Homicidal Thoughts:  No  Memory:  Recent and remote grossly intact  Judgement:  Other:  Fair/improving  Insight:  Fair  Psychomotor Activity:  Normal-no psychomotor agitation or restlessness at this time, was calm/comfortable throughout our session today  Concentration:  Good  Recall:  [place] of Knowledge:Good  Language: Good  Akathisia:  Negative  Handed:  Right  AIMS (if indicated):     Assets:  Desire for Improvement Resilience  Sleep:  Number of Hours: 6.5  Cognition: WNL  ADL's:  Intact       Has this patient used any form of tobacco in the last 30 days? (Cigarettes, Smokeless Tobacco, Cigars, and/or Pipes) Yes, No  Blood Alcohol level:  Lab Results  Component Value Date   ETH <10 35/36/1443    Metabolic Disorder Labs:  Lab Results  Component Value Date   HGBA1C 5.5 12/16/2019   MPG 111.15 12/16/2019   No results found for: PROLACTIN Lab Results  Component Value Date   CHOL 195 12/16/2019   TRIG 47 12/16/2019   HDL 71 12/16/2019   CHOLHDL 2.7 12/16/2019   VLDL 9 12/16/2019   LDLCALC 115 (H) 12/16/2019    See Psychiatric Specialty Exam and Suicide Risk Assessment completed by Attending Physician prior to discharge.  Discharge destination:  Home  Is patient on multiple antipsychotic therapies at discharge:  No   Has Patient had three or more failed trials of antipsychotic monotherapy by history:  No  Recommended Plan for Multiple Antipsychotic Therapies: NA    Allergies as of 12/21/2019       Reactions   Sulfa Antibiotics Other (See Comments)   Swollen muscles, stiff joints and break out        Medication List     TAKE these medications      Indication  lithium carbonate 300 MG CR tablet Commonly known as: LITHOBID Take 1 tablet (300 mg total) by mouth every 12 (twelve) hours.  Indication:  Manic-Depression   OLANZapine 10 MG tablet Commonly known as: ZYPREXA Take 1 tablet (10 mg total) by mouth at bedtime.  Indication: Manic Phase of Manic-Depression       Follow-up Information     [facility] Follow up on 01/26/2020.   Specialty: Behavioral Health Why: You are scheduled for an appointment for medication management on 01/26/20 at 1:00 pm.  This will be a Virtual appointment. Contact information: [address] [place] [address] [phone]        [facility]. Call.   Why: A referral for therapy services has been made on your behalf.  Please call this provider to obtain your appointment information.  Contact information: [address] [place] [address] [phone]            Follow-up recommendations:  Continue activity as tolerated. Continue diet as recommended by your PCP. Ensure to keep all appointments with outpatient providers.  Comments:  Patient is instructed prior to discharge to: Take all medications as prescribed by his/her mental healthcare provider. Report any adverse effects and or reactions from the medicines to his/her outpatient provider promptly. Patient has been instructed & cautioned: To not engage in alcohol and or illegal drug use while on prescription medicines. In the event of worsening symptoms, patient is instructed to call the crisis hotline, 911 and or go to the nearest ED for appropriate evaluation and treatment of symptoms. To follow-up with his/her primary care provider for your other medical issues, concerns and or health care needs.    Signed: Gerlene Burdock Money, FNP 12/21/2019, 12:52 PM  Patient seen, Suicide Assessment Completed.  Disposition Plan Reviewed

## 2019-12-21 NOTE — BHH Suicide Risk Assessment (Addendum)
[facility] Discharge Suicide Risk Assessment   Principal Problem: Bipolar I disorder, most recent episode (or current) manic (HCC) Discharge Diagnoses: Principal Problem:   Bipolar I disorder, most recent episode (or current) manic (HCC)   Total Time spent with patient: 30 minutes  Musculoskeletal: Strength & Muscle Tone: within normal limits Gait & Station: normal Patient leans: N/A  Psychiatric Specialty Exam: Review of Systems no chest pain, no shortness of breath, no vomiting, no fever, no chills  Blood pressure 134/90, pulse 80, temperature 97.8 F (36.6 C), temperature source Oral, resp. rate 18, height 5\' 9"  (1.753 m), weight 77.1 kg, SpO2 100 %.Body mass index is 25.1 kg/m.  General Appearance: Well Groomed  ::  Good  Speech:  Normal Rate-less pressured  Volume:  Normal  Mood:  Reports his mood is "good".  Denies depression  Affect:  Less expansive, not irritable today.  Pleasant on approach  Thought Process:  Linear-thought process better organized/more linear  Orientation:  Other:  Fully alert and attentive  Thought Content:  No hallucinations, no delusions expressed does not appear internally preoccupied  Suicidal Thoughts:  No denies suicidal or self-injurious ideations, no homicidal or violent ideations  Homicidal Thoughts:  No  Memory:  Recent and remote grossly intact  Judgement:  Other:  Fair/improving  Insight:  Fair  Psychomotor Activity:  Normal-no psychomotor agitation or restlessness at this time, was calm/comfortable throughout our session today  Concentration:  Good  Recall:  Good  Fund of Knowledge:Good  Language: Good  Akathisia:  Negative  Handed:  Right  AIMS (if indicated):     Assets:  Desire for Improvement Resilience  Sleep:  Number of Hours: 6.5  Cognition: WNL  ADL's:  Intact   Mental Status Per Nursing Assessment::   On Admission:  NA  Demographic Factors:  54 year old male, has a 65 year old son who lives with an uncle,  employed  Loss Factors: Does not endorse any specific trigger or stressor.  Reported recent MDMA and "mushroom" use.  Historical Factors: He denies prior psychiatric admissions or prior psychiatric illness.  He reported a past history of being treated with Celexa for anxiety.  Risk Reduction Factors:   Positive coping skills or problem solving skills  Continued Clinical Symptoms:  Patient presents alert, attentive, without psychomotor agitation or restlessness.  He appears pleasant and cooperative during session, without irritability or agitation.  His mood is "all right".  Affect is appropriate and currently he does not present overtly irritable or expansive.  Denies depression.  Thought process is noted to be more linear/intact at this time.He makes references regarding being intelligent and being able to process information quickly but no overt delusional ideations or grandiose delusions are noted.  No hyper religious or religious preoccupations noted at this time.  Denies hallucinations and does not appear internally preoccupied.he denies any suicidal ideations, presents future oriented, no homicidal or violent ideations. Staff reports that he has improved significantly over the last 2 to 3 days-he has presented significantly calmer, easily redirectable, without overtly disruptive or agitated behaviors on unit, and decreased/resolved religious ideations. Today noted to be sitting comfortably in dayroom and calm/pleasant on approach. Currently denies medication side effects.  We have reviewed side effect profile to include potential risks associated with lithium (thyroid, renal side effects, potential risk of toxicity, importance of following up with outpatient provider for monitoring).  Patient's LFTs have been elevated but trending down-today AST 87 down from 137 and ALT 167, down from 187.  Patient tested positive  for hepatitis C antibody.  We have reviewed this.  He reports he has been told he  was hep C positive in the past.  We have the availability of effective treatments and the importance of following up in order to continue monitoring LFTs/liver function and initiate treatment as appropriate. With his expressed consent I attempted to reach friend for collateral information, no answer. Of note lithium serum level is low/subtherapeutic.  Patient has agreed to start lithium in addition to Zyprexa but has (I discussed with staff) reiterated that he wants lithium to be titrated slowly.  Currently he does not want to increase dose further.  He is tolerating the current dose of 450 mg daily well and I have advised him to continue titration under the guidance of his outpatient psychiatrist if indicated.  Cognitive Features That Contribute To Risk:  No gross cognitive deficits noted upon discharge. Is alert , attentive, and oriented x 3   Suicide Risk:  Mild:  Suicidal ideation of limited frequency, intensity, duration, and specificity.  There are no identifiable plans, no associated intent, mild dysphoria and related symptoms, good self-control (both objective and subjective assessment), few other risk factors, and identifiable protective factors, including available and accessible social support.  Follow-up Information    [facility] Follow up on 01/26/2020.   Specialty: Behavioral Health Why: You are scheduled for an appointment for medication management on 01/26/20 at 1:00 pm.  This will be a Virtual appointment. Contact information: [place]. Call.   Why: A referral for therapy services has been made on your behalf.  Please call this provider to obtain your appointment information.  Contact information: [address] [place] [address] [phone]           Plan Of Care/Follow-up recommendations:  Activity:  As tolerated Diet:  Regular Tests:   NA Other:  See below Patient is expressing readiness for discharge.  As reviewed with staff he has improved significantly since admission.  At this time there are no ongoing grounds for involuntary commitment.  We have reviewed the importance of outpatient follow-up and he is also been advised regarding potential deleterious effects of illicit substance use.  Patient encouraged to follow-up with his PCP or at ID clinic for management of hep C.  Jenne Campus, MD 12/21/2019, 11:40 AM

## 2019-12-21 NOTE — Progress Notes (Signed)
   12/20/19 2100  COVID-19 Daily Checkoff  Have you had a fever (temp > 37.80C/100F)  in the past 24 hours?  No  If you have had runny nose, nasal congestion, sneezing in the past 24 hours, has it worsened? No  COVID-19 EXPOSURE  Have you traveled outside the state in the past 14 days? No  Have you been in contact with someone with a confirmed diagnosis of COVID-19 or PUI in the past 14 days without wearing appropriate PPE? No  Have you been living in the same home as a person with confirmed diagnosis of COVID-19 or a PUI (household contact)? No  Have you been diagnosed with COVID-19? No   

## 2019-12-21 NOTE — Progress Notes (Signed)
SPIRITUALITY GROUP NOTE  Spirituality group facilitated by Wilkie Aye, MDiv, BCC.  Group Description:  Group focused on topic of hope.  Patients participated in facilitated discussion around topic, connecting with one another around experiences and definitions for hope.  Group members engaged with visual explorer photos, reflecting on what hope looks like for them today.  Group engaged in discussion around how their definitions of hope are present today in hospital.   Modalities: Psycho-social ed, Adlerian, Narrative, MI Patient Progress  Richard Krueger was present throughout group.  Responsive to direction to end phone call at beginning of group.  Richard Krueger engaged in conversation - somewhat discursive and tangential.  Noted that he found hope in the "values of a rave" love, peace, unity, respect.  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  Richard Krueger fell asleep in group and when he awoke at end of group activity, he did not participate.

## 2019-12-21 NOTE — Progress Notes (Signed)
Recreation Therapy Notes  INPATIENT RECREATION TR PLAN  Patient Details Name: Richard Krueger MRN: [id number] DOB: [birth year] Today's Date: 12/21/2019  Rec Therapy Plan Is patient appropriate for Therapeutic Recreation?: Yes Treatment times per week: about 3 days Estimated Length of Stay: 5-7 days TR Treatment/Interventions: Group participation (Comment)  Discharge Criteria Pt will be discharged from therapy if:: Discharged Treatment plan/goals/alternatives discussed and agreed upon by:: Patient/family  Discharge Summary Short term goals set: See patient care plan Short term goals met: Complete Progress toward goals comments: Groups attended Which groups?: Coping skills, Other (Comment)(Team building) Reason goals not met: None Therapeutic equipment acquired: N/A Reason patient discharged from therapy: Discharge from hospital Pt/family agrees with progress & goals achieved: Yes Date patient discharged from therapy: 12/21/19     Victorino Sparrow, LRT/CTRS  Ria Comment, [place] 12/21/2019, 11:49 AM

## 2019-12-21 NOTE — Progress Notes (Signed)
Pt discharged to lobby. Pt was stable and appreciative at that time. All papers and prescriptions were given and valuables returned. Verbal understanding expressed. Denies SI/HI and A/VH. Pt given opportunity to express concerns and ask questions.  

## 2020-01-26 ENCOUNTER — Other Ambulatory Visit: Payer: Self-pay

## 2020-01-26 ENCOUNTER — Ambulatory Visit (HOSPITAL_COMMUNITY): Payer: 59 | Admitting: Psychiatry
